# Patient Record
Sex: Male | Born: 1985 | Race: White | Hispanic: No | Smoking: Former smoker
Health system: Southern US, Community
[De-identification: ages and names within clinical notes are randomized; demographics above are authoritative.]

## PROBLEM LIST (undated history)

## (undated) DIAGNOSIS — J449 Chronic obstructive pulmonary disease, unspecified: Secondary | ICD-10-CM

## (undated) DIAGNOSIS — J45909 Unspecified asthma, uncomplicated: Secondary | ICD-10-CM

## (undated) DIAGNOSIS — F32A Depression, unspecified: Secondary | ICD-10-CM

## (undated) DIAGNOSIS — G4730 Sleep apnea, unspecified: Secondary | ICD-10-CM

## (undated) DIAGNOSIS — K219 Gastro-esophageal reflux disease without esophagitis: Secondary | ICD-10-CM

## (undated) DIAGNOSIS — F419 Anxiety disorder, unspecified: Secondary | ICD-10-CM

## (undated) HISTORY — DX: Chronic obstructive pulmonary disease, unspecified: J44.9

## (undated) HISTORY — DX: Unspecified asthma, uncomplicated: J45.909

## (undated) HISTORY — DX: Sleep apnea, unspecified: G47.30

---

## 2011-10-23 HISTORY — PX: KNEE ARTHROSCOPY: SUR90

## 2018-10-22 HISTORY — PX: NM RENAL LASIX (ARMC HX): HXRAD1213

## 2021-06-29 ENCOUNTER — Other Ambulatory Visit: Payer: Self-pay | Admitting: Surgery

## 2021-06-29 ENCOUNTER — Other Ambulatory Visit (HOSPITAL_COMMUNITY): Payer: Self-pay | Admitting: Surgery

## 2021-08-24 ENCOUNTER — Ambulatory Visit (INDEPENDENT_AMBULATORY_CARE_PROVIDER_SITE_OTHER): Payer: No Typology Code available for payment source | Admitting: Psychology

## 2021-08-24 DIAGNOSIS — F509 Eating disorder, unspecified: Secondary | ICD-10-CM

## 2021-08-29 ENCOUNTER — Other Ambulatory Visit: Payer: Self-pay

## 2021-08-29 ENCOUNTER — Encounter: Payer: Self-pay | Admitting: Skilled Nursing Facility1

## 2021-08-29 ENCOUNTER — Encounter: Payer: No Typology Code available for payment source | Attending: Surgery | Admitting: Skilled Nursing Facility1

## 2021-08-29 DIAGNOSIS — E669 Obesity, unspecified: Secondary | ICD-10-CM | POA: Diagnosis present

## 2021-08-29 NOTE — Progress Notes (Signed)
Nutrition Assessment for Bariatric Surgery Medical Nutrition Therapy Appt Start Time: 9:50    End Time: 11:30  Patient was seen on 08/29/2021 for Pre-Operative Nutrition Assessment. Letter of approval faxed to [facility] bariatric surgery program coordinator on 08/29/2021.   Referral stated Supervised Weight Loss (SWL) visits needed: 0  Pt completed visits.   Pt has cleared nutrition requirements.   Planned surgery: sleeve gastrectomy  Pt expectation of surgery: to reduce pain in his legs Pt expectation of dietitian: none identified     NUTRITION ASSESSMENT   Anthropometrics  Start weight at [facility]: 327.5 lbs (date: 08/29/2021)  Height: 75.5 in BMI: 40.39 kg/m2     Clinical  Medical hx: COPD, sleep apnea, GAD, Major depressive  Medications: see list; multivitamin   Labs: Vitamin D 20.8 Notable signs/symptoms: some knee joint pain Any previous deficiencies? Yes: vitamin D  Micronutrient Nutrition Focused Physical Exam: Hair: No issues observed Eyes: No issues observed Mouth: No issues observed Neck: No issues observed Nails: No issues observed Skin: No issues observed  Lifestyle & Dietary Hx  Pt states if he does experience pain with working out.  Pt states he will eat the rest of his daughters plate so they do not waste food but has been avoiding that behavior stating it has gone pretty easy to change that up. Pt states he has been working on portion control overall.   Pt states he has on and off sleep and is trying to working on better sleep hygeine.    24-Hr Dietary Recall First Meal: fruity pebbles cereal + almond milk Snack:  Second Meal 11-1: canned soup or ramen  Snack:  Third Meal 7:30: pork chops + white rice + broccoli + cheese Snack:  Beverages: water   Estimated Energy Needs Calories: 2000   NUTRITION DIAGNOSIS  Overweight/obesity ([place]-3.3) related to past poor dietary habits and physical inactivity as evidenced by patient w/ planned  sleeve gastrectomy surgery following dietary guidelines for continued weight loss.    NUTRITION INTERVENTION  Nutrition counseling (C-1) and education (E-2) to facilitate bariatric surgery goals.  Educated pt on micronutrient deficiencies post surgery and strategies to mitigate that risk   Pre-Op Goals Reviewed with the Patient Track food and beverage intake (pen and paper, MyFitness Pal, Baritastic app, etc.) Make healthy food choices while monitoring portion sizes Consume 3 meals per day or try to eat every 3-5 hours Avoid concentrated sugars and fried foods Keep sugar & fat in the single digits per serving on food labels Practice CHEWING your food (aim for applesauce consistency) Practice not drinking 15 minutes before, during, and 30 minutes after each meal and snack Avoid all carbonated beverages (ex: soda, sparkling beverages)  Limit caffeinated beverages (ex: coffee, tea, energy drinks) Avoid all sugar-sweetened beverages (ex: regular soda, sports drinks)  Avoid alcohol  Aim for 64-100 ounces of FLUID daily (with at least half of fluid intake being plain water)  Aim for at least 60-80 grams of PROTEIN daily Look for a liquid protein source that contains ?15 g protein and ?5 g carbohydrate (ex: shakes, drinks, shots) Make a list of non-food related activities Physical activity is an important part of a healthy lifestyle so keep it moving! The goal is to reach 150 minutes of exercise per week, including cardiovascular and weight baring activity.  *Goals that are bolded indicate the pt would like to start working towards these  Handouts Provided Include  Bariatric Surgery handouts (Nutrition Visits, Pre-Op Goals, Protein Shakes, Vitamins & Minerals)  Learning Style & Readiness for Change Teaching method utilized: Visual & Auditory  Demonstrated degree of understanding via: Teach Back  Readiness Level: Action Barriers to learning/adherence to lifestyle change: none identified     MONITORING & EVALUATION Dietary intake, weekly physical activity, body weight, and pre-op goals reached at next nutrition visit.    Next Steps  Patient is to follow up at [facility] for Pre-Op Class >2 weeks before surgery for further nutrition education.  Pt has completed visits. No further supervised visits required/recomended

## 2021-09-04 ENCOUNTER — Ambulatory Visit: Payer: No Typology Code available for payment source | Admitting: Psychology

## 2021-09-08 ENCOUNTER — Other Ambulatory Visit (HOSPITAL_COMMUNITY): Payer: Self-pay | Admitting: Surgery

## 2021-09-11 ENCOUNTER — Ambulatory Visit: Payer: Self-pay | Admitting: Surgery

## 2021-09-22 ENCOUNTER — Other Ambulatory Visit: Payer: Self-pay

## 2021-09-22 ENCOUNTER — Ambulatory Visit (HOSPITAL_BASED_OUTPATIENT_CLINIC_OR_DEPARTMENT_OTHER)
Admission: RE | Admit: 2021-09-22 | Discharge: 2021-09-22 | Disposition: A | Discharge status: Home / Self Care | Payer: No Typology Code available for payment source | Source: Ambulatory Visit | Attending: Surgery | Admitting: Surgery

## 2021-09-28 ENCOUNTER — Encounter (HOSPITAL_COMMUNITY)
Admission: RE | Admit: 2021-09-28 | Discharge: 2021-09-28 | Disposition: A | Discharge status: Home / Self Care | Payer: No Typology Code available for payment source | Source: Ambulatory Visit | Attending: Surgery | Admitting: Surgery

## 2021-09-28 DIAGNOSIS — Z01818 Encounter for other preprocedural examination: Secondary | ICD-10-CM | POA: Diagnosis not present

## 2021-11-03 ENCOUNTER — Encounter (HOSPITAL_COMMUNITY): Payer: Self-pay | Admitting: Surgery

## 2021-11-14 ENCOUNTER — Ambulatory Visit (HOSPITAL_COMMUNITY): Payer: No Typology Code available for payment source | Admitting: Certified Registered"

## 2021-11-14 ENCOUNTER — Encounter (HOSPITAL_COMMUNITY): Payer: Self-pay | Admitting: Surgery

## 2021-11-14 ENCOUNTER — Encounter (HOSPITAL_COMMUNITY)
Admission: RE | Disposition: A | Discharge status: Home / Self Care | Payer: Self-pay | Source: Ambulatory Visit | Attending: Surgery

## 2021-11-14 ENCOUNTER — Ambulatory Visit (HOSPITAL_COMMUNITY)
Admission: RE | Admit: 2021-11-14 | Discharge: 2021-11-14 | Disposition: A | Discharge status: Home / Self Care | Payer: No Typology Code available for payment source | Source: Ambulatory Visit | Attending: Surgery | Admitting: Surgery

## 2021-11-14 ENCOUNTER — Other Ambulatory Visit: Payer: Self-pay

## 2021-11-14 DIAGNOSIS — J449 Chronic obstructive pulmonary disease, unspecified: Secondary | ICD-10-CM | POA: Diagnosis not present

## 2021-11-14 DIAGNOSIS — Z6841 Body Mass Index (BMI) 40.0 and over, adult: Secondary | ICD-10-CM | POA: Insufficient documentation

## 2021-11-14 DIAGNOSIS — K219 Gastro-esophageal reflux disease without esophagitis: Secondary | ICD-10-CM | POA: Diagnosis present

## 2021-11-14 DIAGNOSIS — G4733 Obstructive sleep apnea (adult) (pediatric): Secondary | ICD-10-CM | POA: Diagnosis not present

## 2021-11-14 HISTORY — PX: ESOPHAGOGASTRODUODENOSCOPY: SHX5428

## 2021-11-14 HISTORY — PX: BIOPSY: SHX5522

## 2021-11-14 SURGERY — EGD (ESOPHAGOGASTRODUODENOSCOPY)
Anesthesia: Monitor Anesthesia Care

## 2021-11-14 MED ORDER — PROPOFOL 500 MG/50ML IV EMUL
INTRAVENOUS | Status: DC | PRN
  Administered 2021-11-14: 125 ug/kg/min via INTRAVENOUS

## 2021-11-14 MED ORDER — LACTATED RINGERS IV SOLN: INTRAVENOUS | Status: DC

## 2021-11-14 MED ORDER — PROPOFOL 10 MG/ML IV BOLUS
INTRAVENOUS | Status: DC | PRN
  Administered 2021-11-14 (×2): 30 mg via INTRAVENOUS
  Administered 2021-11-14: 20 mg via INTRAVENOUS

## 2021-11-14 MED ORDER — PROPOFOL 1000 MG/100ML IV EMUL
INTRAVENOUS | Status: AC
  Filled 2021-11-14: qty 100

## 2021-11-14 NOTE — H&P (Signed)
Admitting Physician: [place]  Service: Bariatric surgery  CC: GERD, Obesity  Subjective   HPI: Terry Pineda is an 36 y.o. male who is here for EGD prior to bariatric surgery.  Past Medical History:  Diagnosis Date   Asthma    COPD (chronic obstructive pulmonary disease) (HCC)    Sleep apnea     History reviewed. No pertinent surgical history.  History reviewed. No pertinent family history.  Social:  has no history on file for tobacco use, alcohol use, and drug use.  Allergies: No Known Allergies  Medications: Current Outpatient Medications  Medication Instructions   albuterol (VENTOLIN HFA) 108 (90 Base) MCG/ACT inhaler 1-2 puffs, Inhalation, Every 6 hours PRN   amitriptyline (ELAVIL) 50 mg, Oral, Daily at bedtime   FLUoxetine (PROZAC) 20 mg, Oral, 2 times daily   hydrOXYzine (ATARAX) 25 mg, Oral, 3 times daily    ROS - all of the below systems have been reviewed with the patient and positives are indicated with bold text General: chills, fever or night sweats Eyes: blurry vision or double vision ENT: epistaxis or sore throat Allergy/Immunology: itchy/watery eyes or nasal congestion Hematologic/Lymphatic: bleeding problems, blood clots or swollen lymph nodes Endocrine: temperature intolerance or unexpected weight changes Breast: new or changing breast lumps or nipple discharge Resp: cough, shortness of breath, or wheezing CV: chest pain or dyspnea on exertion GI: as per HPI GU: dysuria, trouble voiding, or hematuria MSK: joint pain or joint stiffness Neuro: TIA or stroke symptoms Derm: pruritus and skin lesion changes Psych: anxiety and depression  Objective   PE There were no vitals taken for this visit. Constitutional: NAD; conversant; no deformities Eyes: Moist conjunctiva; no lid lag; anicteric; PERRL Neck: Trachea midline; no thyromegaly Lungs: Normal respiratory effort; no tactile fremitus CV: RRR; no palpable thrills; no  pitting edema GI: Abd Soft, nontender; no palpable hepatosplenomegaly MSK: Normal range of motion of extremities; no clubbing/cyanosis Psychiatric: Appropriate affect; alert and oriented x3 Lymphatic: No palpable cervical or axillary lymphadenopathy  No results found for this or any previous visit (from the past 24 hour(s)).  Imaging Orders  No imaging studies ordered today     Assessment and Plan   Morbid (severe) obesity due to excess calories (CMS-HCC) Obstructive sleep apnea  Terry Pineda is a 36 y.o. male who is seen today as an office consultation at the request of Dr. Daneil Dolin for initial bariatric surgery consultation. The patient has morbid obesity with a BMI of Body mass index is 38 kg/m. and the following conditions related to obesity: Obstructive sleep apnea. Notably his blood pressure was high in the office, I will have him check his blood pressure at home and follow-up with his primary care doctor if it remains high on home checks..  Today we discussed the surgical options to treat obesity and its associated comorbidity. After discussing the available procedures in the region, we discussed in great detail the surgeries I offer: robotic sleeve gastrectomy and robotic roux-en-y gastric bypass. We discussed the procedures themselves as well as their risks, benefits and alternatives. I entered the patient's basic information into the [facility] Metabolic Surgery Risk/Benefit Calculator to facilitate this discussion.   After a full discussion and all questions answered, the patient is interested in pursuing a sleeve gastrectomy.  The bariatric surgery preoperative pathway to included the following: - Bloodwork - completed 06/30/21 - Dietician consult - completed with Sandie Ano, RD 08/29/21 - Chest x-ray - negative 09/22/21 - EKG - Normal sinus  rhythm 09/28/21 - Psychology evaluation - Notably his blood pressure was high in the office, I will have him check his blood  pressure at home and follow-up with his primary care doctor if it remains high on home checks.  Today he presents for upper endoscopy with biopsy. I explained my rational for performing upper endoscopy in my bariatric patients. During the procedure I will biopsy for H. Pylori, evaluate for hiatal hernia, evaluate for reflux esophagitis and look for any other abnormalities that may influence the procedure. We discussed the risks, benefits and alternative to this procedure and the patient granted consent to proceed.  After the above evaluation is complete, we will meet to continue our discussion and work towards scheduling surgery.     Felicie Morn, MD  [facility] Use [email or web address] to contact on call provider

## 2021-11-14 NOTE — Anesthesia Procedure Notes (Signed)
Procedure Name: MAC Date/Time: 11/14/2021 1:00 PM Performed by: Claudia Desanctis, CRNA Pre-anesthesia Checklist: Patient identified, Emergency Drugs available, Suction available and Patient being monitored Patient Re-evaluated:Patient Re-evaluated prior to induction Oxygen Delivery Method: Simple face mask

## 2021-11-14 NOTE — Anesthesia Preprocedure Evaluation (Addendum)
Anesthesia Evaluation  Patient identified by MRN, date of birth, ID band Patient awake    Reviewed: Allergy & Precautions, NPO status , Patient's Chart, lab work & pertinent test results  Airway Mallampati: II  TM Distance: >3 FB Neck ROM: Full    Dental no notable dental hx.    Pulmonary asthma , sleep apnea , COPD, former smoker,    Pulmonary exam normal breath sounds clear to auscultation       Cardiovascular negative cardio ROS Normal cardiovascular exam Rhythm:Regular Rate:Normal  ECG: NSR, rate 99   Neuro/Psych negative neurological ROS     GI/Hepatic Neg liver ROS, GERD  ,  Endo/Other  Morbid obesity  Renal/GU negative Renal ROS     Musculoskeletal negative musculoskeletal ROS (+)   Abdominal (+) + obese,   Peds  Hematology negative hematology ROS (+)   Anesthesia Other Findings gastroesophageal reflux disease, morbid obesity  Reproductive/Obstetrics                            Anesthesia Physical Anesthesia Plan  ASA: 3  Anesthesia Plan: MAC   Post-op Pain Management:    Induction: Intravenous  PONV Risk Score and Plan: 1 and Propofol infusion and Treatment may vary due to age or medical condition  Airway Management Planned: Nasal Cannula  Additional Equipment:   Intra-op Plan:   Post-operative Plan:   Informed Consent: I have reviewed the patients History and Physical, chart, labs and discussed the procedure including the risks, benefits and alternatives for the proposed anesthesia with the patient or authorized representative who has indicated his/her understanding and acceptance.     Dental advisory given  Plan Discussed with: CRNA  Anesthesia Plan Comments:         Anesthesia Quick Evaluation

## 2021-11-14 NOTE — Transfer of Care (Signed)
Immediate Anesthesia Transfer of Care Note  Patient: Terry Pineda  Procedure(s) Performed: ESOPHAGOGASTRODUODENOSCOPY (EGD) BIOPSY  Patient Location: PACU  Anesthesia Type:MAC  Level of Consciousness: awake, alert  and oriented  Airway & Oxygen Therapy: Patient Spontanous Breathing  Post-op Assessment: Report given to RN  Post vital signs: Reviewed and stable  Last Vitals:  Vitals Value Taken Time  BP    Temp    Pulse    Resp    SpO2      Last Pain:  Vitals:   11/14/21 1136  TempSrc: Oral  PainSc: 0-No pain         Complications: No notable events documented.

## 2021-11-14 NOTE — Op Note (Signed)
[facility] Patient Name: Terry Pineda Procedure Date: 11/14/2021 MRN: [id number] Attending MD: Felicie Morn ,  Date of Birth: 01/21/1986 CSN: [id number] Age: 36 Admit Type: Inpatient Procedure:                Upper GI endoscopy Indications:              Obesity due to excess calories, Helicobacter pylori                            status to be determined Providers:                Felicie Morn, Dulcy Fanny, Luan Moore, Technician, Dellie Catholic Referring MD:              Medicines:                Monitored Anesthesia Care Complications:            No immediate complications. Estimated Blood Loss:     Estimated blood loss: none. Estimated blood loss                            was minimal. Procedure:                Pre-Anesthesia Assessment:                           - Prior to the procedure, a History and Physical                            was performed, and patient medications and                            allergies were reviewed. The patient is competent.                            The risks and benefits of the procedure and the                            sedation options and risks were discussed with the                            patient. All questions were answered and informed                            consent was obtained. Patient identification and                            proposed procedure were verified by the physician                            in the pre-procedure area. Mental Status                            Examination:  alert and oriented. Airway                            Examination: normal oropharyngeal airway and neck                            mobility. Respiratory Examination: clear to                            auscultation. CV Examination: normal. ASA Grade                            Assessment: II - A patient with mild systemic                            disease. After reviewing the risks  and benefits,                            the patient was deemed in satisfactory condition to                            undergo the procedure. The anesthesia plan was to                            use monitored anesthesia care (MAC). Immediately                            prior to administration of medications, the patient                            was re-assessed for adequacy to receive sedatives.                            The heart rate, respiratory rate, oxygen                            saturations, blood pressure, adequacy of pulmonary                            ventilation, and response to care were monitored                            throughout the procedure. The physical status of                            the patient was re-assessed after the procedure.                           After obtaining informed consent, the endoscope was                            passed under direct vision. Throughout the  procedure, the patient's blood pressure, pulse, and                            oxygen saturations were monitored continuously. The                            [id number]-H190 ([id number]) Olympus endoscope was introduced                            through the mouth, and advanced to the second part                            of duodenum. The upper GI endoscopy was                            accomplished without difficulty. The patient                            tolerated the procedure well. Scope In: Scope Out: Findings:      The examined esophagus was normal.      The entire examined stomach was normal. Biopsies were taken with a cold       forceps for Helicobacter pylori testing. Verification of patient       identification for the specimen was done. Estimated blood loss was       minimal.      The in the duodenum was normal.      The gastroesophageal flap valve was visualized endoscopically and       classified as Hill Grade I (prominent fold, tight to  endoscope). Impression:               - Normal esophagus.                           - Normal stomach. Biopsied.                           - Normal.                           - Gastroesophageal flap valve classified as Hill                            Grade I (prominent fold, tight to endoscope). Moderate Sedation:      Moderate (conscious) sedation was personally administered by an       anesthesia professional. The following parameters were monitored: oxygen       saturation, heart rate, blood pressure, and response to care. Total       physician intraservice time was 15 minutes. Recommendation:            Procedure Code(s):        --- Professional ---                           [phone], Esophagogastroduodenoscopy, flexible,                            transoral; with biopsy, single  or multiple Diagnosis Code(s):        --- Professional ---                           E66.09, Other obesity due to excess calories CPT copyright 2019 American Medical Association. All rights reserved. The codes documented in this report are preliminary and upon coder review may  be revised to meet current compliance requirements. [place],  11/14/2021 1:20:24 PM Number of Addenda: 0

## 2021-11-14 NOTE — Anesthesia Postprocedure Evaluation (Signed)
Anesthesia Post Note  Patient: Terry Pineda  Procedure(s) Performed: ESOPHAGOGASTRODUODENOSCOPY (EGD) BIOPSY     Patient location during evaluation: Endoscopy Anesthesia Type: MAC Level of consciousness: awake Pain management: pain level controlled Vital Signs Assessment: post-procedure vital signs reviewed and stable Respiratory status: spontaneous breathing, nonlabored ventilation, respiratory function stable and patient connected to nasal cannula oxygen Cardiovascular status: stable and blood pressure returned to baseline Postop Assessment: no apparent nausea or vomiting Anesthetic complications: no   No notable events documented.  Last Vitals:  Vitals:   11/14/21 1330 11/14/21 1340  BP: 129/79 135/74  Pulse: 70 65  Resp:  13  Temp:    SpO2: 98% 98%    Last Pain:  Vitals:   11/14/21 1340  TempSrc:   PainSc: 0-No pain                 Royce Stegman P Wesly Whisenant

## 2021-11-14 NOTE — Discharge Instructions (Signed)
YOU HAD AN ENDOSCOPIC PROCEDURE TODAY: Refer to the procedure report and other information in the discharge instructions given to you for any specific questions about what was found during the examination. If this information does not answer your questions, please call [facility] office at [phone] to clarify.   YOU SHOULD EXPECT: Some feelings of bloating in the abdomen. Passage of more gas than usual. Walking can help get rid of the air that was put into your GI tract during the procedure and reduce the bloating.  DIET: Your first meal following the procedure should be a light meal and then it is ok to progress to your normal diet. A half-sandwich or bowl of soup is an example of a good first meal. Heavy or fried foods are harder to digest and may make you feel nauseous or bloated. Drink plenty of fluids but you should avoid alcoholic beverages for 24 hours.  ACTIVITY: Your care partner should take you home directly after the procedure. You should plan to take it easy, moving slowly for the rest of the day. You can resume normal activity the day after the procedure however YOU SHOULD NOT DRIVE, use power tools, machinery or perform tasks that involve climbing or major physical exertion for 24 hours (because of the sedation medicines used during the test).   SYMPTOMS TO REPORT IMMEDIATELY: A general surgeon can be reached at any hour. Please call [phone]  for any of the following symptoms:   Following upper endoscopy (EGD, EUS, ERCP, esophageal dilation) Vomiting of blood or coffee ground material  New, significant abdominal pain  New, significant chest pain or pain under the shoulder blades  Painful or persistently difficult swallowing  New shortness of breath  Black, tarry-looking or red, bloody stools  FOLLOW UP:  If any biopsies were taken you will be contacted by phone or by letter within the next 1-3 weeks. Call [phone]  if you have not heard about the biopsies  in 3 weeks.  Please also call with any specific questions about appointments or follow up tests.  

## 2021-11-15 ENCOUNTER — Encounter (HOSPITAL_COMMUNITY): Payer: Self-pay | Admitting: Surgery

## 2021-11-15 LAB — SURGICAL PATHOLOGY

## 2022-01-31 ENCOUNTER — Ambulatory Visit (INDEPENDENT_AMBULATORY_CARE_PROVIDER_SITE_OTHER): Payer: No Typology Code available for payment source | Admitting: Psychology

## 2022-01-31 DIAGNOSIS — F509 Eating disorder, unspecified: Secondary | ICD-10-CM

## 2022-01-31 NOTE — Progress Notes (Signed)
. ? ? Bariatric Evaluation  ?  ?CONFIDENTIAL  ?Client Name: Terry Pineda                                                                                  MRN:  [id number] ?Date of Birth: 05-11-1986                                                                  Date of Evaluation: 08/24/21 ?Total Assessment Time: 108 Minutes                                                                     Date of Report: 08/24/21 ?Evaluator: Buena Irish, MSW, LCSW                                                               Date of Follow-up: 01/31/2022 ?Referring Physician:   Dr. Delford Field,  ?[facility] ?  ? ?Sources of Information ?Background Interview ?Bariatric Questionnaire ?[place] Interview for Gastric Bypass ?Beck Anxiety Inventory (BAI) ?Beck Depression Inventory, 2nd Edition (BDI-II) ?Eating Disorder Diagnostic Scale (EDDS) ?Mental Status Examination ?Mood Disorder Questionnaire (MDQ) ?Review of Medical Record (provided by [facility]) ?Letter from Psychiatrist ?Letter from Therapist ? ?Patient Identification and Chief Complaint: ?Terry Pineda lives in [place], [place]. Terry Pineda lives with his teenage daughter. Terry Pineda's father is aware and supportive of his pursuit of bariatric surgery. Terry Pineda's father will be traveling to [place]'s home to help with aftercare post-operatively.  ? ?Terry Pineda is not currently employed and is currently on [employer] disability due to mental and physical health concerns. Terry Pineda was previously in [employer] but was discharged ~11 years ago.   ? ?Terry Pineda has history of sleep apnea, major depression, and anxiety. Additionally, shortness of breath, arthritis (neck, knees, and ankles), and reflux.  Terry Pineda is receiving psychiatric services (Dr. Burr Medico) via the [facility] and is prescribed Hydroxyzine 65m qd  ?and Prozac 246mqd. Terry Pineda a history of counseling and found some benefit, at times, to treatment. Terry Pineda denied caffeine use, alcohol, or tobacco use. Terry Pineda denied any substance use.  ? ? ?Patient's  Understanding of the Procedure: ?Terry Pineda noted his interest in the gastric sleeve. Terry Pineda provided a description of the surgical process including the use of laparoscopy, removal of a large portion of the stomach, and the creation of a pouch. Terry Pineda was able to review many of the risks associated with the surgery and anesthesia. Terry Pineda would benefit from an in-depth of the risks associated post-surgery and the  less common side-effects of surgery. Additionally, Terry Pineda would benefit from in-depth of the dietary regimen. Terry Pineda noted his motivation for surgery which includes improving his health and fitness.  ? ? ?History of Weight Gains and Losses: ?Current weight, height, BMI, desired weight: 325lbs, 6'5?, ~200.  ?Rate current activity level from 1 (sedentary) to 10 (very active): 2 ?Methods of exercise: Walking dog (3x 5-10 minutes). Limited by physical pain.  ?Eating Disorder History (binging, purging, laxatives): Terry Pineda denied any history of diagnosed disordered eating.  ?History of dieting and results: ? ?~2011: Terry Pineda, lost 7-10lbs, maintained 60 days.  ?~2014: Atkins, Lost 7-10lbs, maintained for 30 days.  ?~2017: Terry Pineda fast, no weight-loss due to gastrointestinal issues.  ?Current: Counting calories (reduction of soda consumption and excess calories), weight-loss totals fluctuate.  ?~2019: [employer] Diet, lost 20lbs, maintained for 30 days.  ? ?Diet and Plans for the Future: ? ?Breakfast Cereal or eggs and bacon  ?Lunch Usually left overs  ?Dinner [employer] or ground beef mixed veg Red skin potatoes white rice  ?Snacks Denied   ?  ?Plans for exercise post-surgery: Lifting weights. ?How pt copes with stress: Sleep, therapy.   ?Other planned changes for the future: Improve health and fitness.  ? ?Mental Status Examination: ?Terry Pineda was early to the session which was completed via video-conferencing interview. Terry Pineda was dressed casually and his hygiene was observed to be good. Terry Pineda was oriented to person, place, and time. His  attitude was cooperative and cheerful. There were no unusual psychomotor movements or changes. Speech patterns were normal in rate, tone, volume, and without pressure. Affect was flat. Thought processes were goal-directed and logical. Memory and concentration for both long and short-term were intact. Insight and judgement were good overall. ? ?Intellectual and Psychological Functioning ?Terry Pineda was very warm and friendly. Terry Pineda presented with no significant formal thought disorder. Terry Pineda appears to be primarily facing health stressors, which aligns with his interest in the bariatric process. Terry Pineda completed the Mood disorder Questionnaire -and did not meet criteria for Bipolar Disorder. Additionally, Terry Pineda completed the following measures, Beck's Depression Index netted a score of      31/63, which is indicative of SEVERE depression symptomatology Beck Anxiety Inventory netted a score   17/63 which is indicative of moderate anxiety symptomatology, and Eating Disorder Diagnostic Scale - in which Terry Pineda did not meet criteria for an eating disorder. However, it should be noted that Terry Pineda has a history of binging behavior around ~1.5 years ago without loss of control. Terry Pineda noted often eating 3 servings of a meal while his daughter ate one serving. [name] denied any binging since ~1.5 years ago.  ? ?Terry Pineda has a history of depression and received psychiatric treatment with the [place]. Terry Pineda presented with a flat affect, presented as depressed, and endorsed depressive symptoms via self-report using Beck's Depression Index which netted a score of 31/63. This is indicative of severe depression. Additionally, Terry Pineda endorsed a fluctuating sleep scheduled and hypersomnia, often sleeping 12-14 hours per day in total. Terry Pineda also endorsed loss of interest. Terry Pineda will be required to address his mood via a psychiatric consult and engagement in therapy. Once Terry Pineda has had a follow-up with his prescriber and engaged in therapy, Terry Pineda will need to  provide evaluator with letter from both providers discussed treatment, progress, and prognosis. Rehaan received a template regarding the requested information. Once Terry Pineda has made the necessary progress, which would reflect in his overall mood, Macky will be scheduled for the follow-up for this evaluation  to complete the evaluation process. Additionally, Ahad has not yet met with the registered dietician, which is often a required step prior to consideration with surgery.  ? ?01/31/22 ? ?Since the initial evaluation, Camryn has been receiving psychological treatment from Abelardo Diesel, [facility], [place] with [facility] in [place], [place]. Ms. Gilford Rile has provided a letter, which will be included with the evaluation, reviewing his treatment. Ms. Gilford Rile stated that Wilian has been meeting with her for sessions every two weeks. She noted that she intends to continue treatment with Nicki Reaper, which was confirmed verbally by Nicki Reaper during our follow-up today.  ? ?Additionally, Filipe provided a letter from his psychiatric provider, Dr. Burr Medico with the [facility] of [facility]. This letter will also be included with this evaluation for review.  ? ?Conclusions & Recommendations ?Mr. Adrin Julian is a 36 year-old Caucasian male who was referred to the [place] division by Dr. Delford Field at [facility] for a psychological evaluation to determine his suitability for bariatric surgery.  ? ?With regard to bariatric surgery, Mr. Latron Ribas appears to be motivated for surgery but would benefit from an in-depth of the risks associated post-surgery and the less common side-effects of surgery. Additionally, Terry Pineda would benefit from in-depth of the dietary regimen. Results of this evaluation yielded a past history of depression and anxiety. At this time, Mr. Jeremyah Jelley appears to be able to make an informed decision about the surgery Terry Pineda is contemplating. Terry Pineda expressed understanding of the  post-surgical requirements and appears to me motivated to complete said requirements. If his surgery is scheduled more than 3 months from today's date (01/31/2022) Terry Pineda is required to schedule a follow-up visit in order for th

## 2022-01-31 NOTE — Progress Notes (Signed)
Time Spent: 8:05  am - 8:38 am : 33 Minutes ? ?Hamish Banks Fritzler participated in the session via  video from car and consented to treatment. We later switched to audio due to technical difficulties. We met online due to COVID-19 pandemic. Identifty was confirmed via three factors. Therapist participated from home office. We completed the interactive feedback session which includes reviewing the following measures: Beck Anxiety Inventory (BAI), Beck's Depression Index (BDI), Eating Disorder Diagnostic Scale (EDDS), Mental Status Examination (MSE), & Mood Disorder Questionnaire (MDQ). During this interactive feedback session we discussed the results of the aforementioned measures, treatment recommendations, and the final determination regarding the psychological approval for bariatric surgery. Terry Pineda is approved for surgery.  ? ?Code 96130 was billed to account for written report which includes integration of patient data, interpretation of standardized test results, interpretation of clinical data, & clinical decision making (60 minutes in total) completed on 08/24/2021. ? ?The interactive feedback session was completed today and a total of 33 minutes was spent on feedback alone. Code [phone] was billed for feedback session.  ? ?Diagnosis code: e66.01 ? ?Buena Irish, LCSW ?

## 2022-03-02 ENCOUNTER — Ambulatory Visit: Payer: Self-pay | Admitting: Surgery

## 2022-03-02 DIAGNOSIS — Z01818 Encounter for other preprocedural examination: Secondary | ICD-10-CM

## 2022-03-09 NOTE — Patient Instructions (Signed)
DUE TO COVID-19 ONLY TWO VISITORS  (aged 34 and older)  ARE ALLOWED TO COME WITH YOU AND STAY IN THE WAITING ROOM ONLY DURING PRE OP AND PROCEDURE.   **NO VISITORS ARE ALLOWED IN THE SHORT STAY AREA OR RECOVERY ROOM!!**  IF YOU WILL BE ADMITTED INTO THE HOSPITAL YOU ARE ALLOWED ONLY FOUR SUPPORT PEOPLE DURING VISITATION HOURS ONLY (7 AM -8PM)   The support person(s) must pass our screening, gel in and out, and wear a mask at all times, including in the patient's room. Patients must also wear a mask when staff or their support person are in the room. Visitors GUEST BADGE MUST BE WORN VISIBLY  One adult visitor may remain with you overnight and MUST be in the room by 8 P.M.     Your procedure is scheduled on: 03/26/22   Report to [facility] Main Entrance    Report to short stay at 5:15  AM   Call this number if you have problems the morning of surgery [phone]   Do not eat food :After 6:00 PM   After you may have the following liquids until _4:30_ AM/  DAY OF SURGERY  Water Black Coffee (sugar ok, NO MILK/CREAM OR CREAMERS)  Tea (sugar ok, NO MILK/CREAM OR CREAMERS) regular and decaf                             Plain Jell-O (NO RED)                                           Fruit ices (not with fruit pulp, NO RED)                                     Popsicles (NO RED)                                                                  Juice: apple, WHITE grape, WHITE cranberry Sports drinks like Gatorade (NO RED) Clear broth(vegetable,chicken,beef)                     The day of surgery:   Nothing else to drink after completing the   G2. At 4:30 AM    THE G2 DRINK YOU DRINK BEFORE YOU LEAVE HOME WILL BE THE LAST FLUIDS YOU DRINK BEFORE SURGERY THEN NOTHING MORE BY MOUTH.    PAIN IS EXPECTED AFTER SURGERY AND WILL NOT BE COMPLETELY ELIMINATED.   AMBULATION AND TYLENOL WILL HELP REDUCE INCISIONAL AND GAS PAIN. MOVEMENT IS KEY!  YOU ARE EXPECTED TO BE OUT OF BED  WITHIN 4 HOURS OF ADMISSION TO YOUR PATIENT ROOM.  SITTING IN THE RECLINER THROUGHOUT THE DAY IS IMPORTANT FOR DRINKING FLUIDS AND MOVING GAS THROUGHOUT THE GI TRACT.  COMPRESSION STOCKINGS SHOULD BE WORN [facility] STAY UNLESS YOU ARE WALKING.   INCENTIVE SPIROMETER SHOULD BE USED EVERY HOUR WHILE AWAKE TO DECREASE POST-OPERATIVE COMPLICATIONS SUCH AS PNEUMONIA.  WHEN DISCHARGED HOME, IT IS IMPORTANT TO CONTINUE TO WALK EVERY  HOUR AND USE THE INCENTIVE SPIROMETER EVERY HOUR.                 Oral Hygiene is also important to reduce your risk of infection.                                    Remember - BRUSH YOUR TEETH THE MORNING OF SURGERY WITH YOUR REGULAR TOOTHPASTE   Do NOT smoke after Midnight   Take these medicines the morning of surgery with A SIP OF WATER: Prozac, Hydroxyzine if needed Use your inhaler and bring it with you   Bring CPAP mask and tubing day of surgery.                              You may not have any metal on your body including jewelry, and body piercing             Do not wear lotions, powders, perfumes/cologne, or deodorant                Men may shave face and neck.   Do not bring valuables to the hospital. [facility] IS NOT             RESPONSIBLE   FOR VALUABLES.   Contacts, dentures or bridgework may not be worn into surgery.   Bring small overnight bag day of surgery.     Special Instructions: Bring a copy of your healthcare power of attorney and living will documents the day of surgery if you haven't scanned them before.              Please read over the following fact sheets you were given: IF YOU HAVE QUESTIONS ABOUT YOUR PRE-OP INSTRUCTIONS PLEASE CALL [phone]     [facility] - Preparing for Surgery Before surgery, you can play an important role.  Because skin is not sterile, your skin needs to be as free of germs as possible.  You can reduce the number of germs on your skin by washing with CHG (chlorahexidine  gluconate) soap before surgery.  CHG is an antiseptic cleaner which kills germs and bonds with the skin to continue killing germs even after washing. Please DO NOT use if you have an allergy to CHG or antibacterial soaps.  If your skin becomes reddened/irritated stop using the CHG and inform your nurse when you arrive at Short Stay.  You may shave your face/neck. Please follow these instructions carefully:  1.  Shower with CHG Soap the night before surgery and the  morning of Surgery.  2.  If you choose to wash your hair, wash your hair first as usual with your  normal  shampoo.  3.  After you shampoo, rinse your hair and body thoroughly to remove the  shampoo.                            4.  Use CHG as you would any other liquid soap.  You can apply chg directly  to the skin and wash                       Gently with a scrungie or clean washcloth.  5.  Apply the CHG Soap to your body ONLY FROM THE NECK DOWN.   Do not  use on face/ open                           Wound or open sores. Avoid contact with eyes, ears mouth and genitals (private parts).                       Wash face,  Genitals (private parts) with your normal soap.             6.  Wash thoroughly, paying special attention to the area where your surgery  will be performed.  7.  Thoroughly rinse your body with warm water from the neck down.  8.  DO NOT shower/wash with your normal soap after using and rinsing off  the CHG Soap.                9.  Pat yourself dry with a clean towel.            10.  Wear clean pajamas.            11.  Place clean sheets on your bed the night of your first shower and do not  sleep with pets. Day of Surgery : Do not apply any lotions/deodorants the morning of surgery.  Please wear clean clothes to the hospital/surgery center.  FAILURE TO FOLLOW THESE INSTRUCTIONS MAY RESULT IN THE CANCELLATION OF YOUR SURGERY PATIENT SIGNATURE_________________________________  NURSE  SIGNATURE__________________________________  ________________________________________________________________________   Rogelia Mire  An incentive spirometer is a tool that can help keep your lungs clear and active. This tool measures how well you are filling your lungs with each breath. Taking long deep breaths may help reverse or decrease the chance of developing breathing (pulmonary) problems (especially infection) following: A long period of time when you are unable to move or be active. BEFORE THE PROCEDURE  If the spirometer includes an indicator to show your best effort, your nurse or respiratory therapist will set it to a desired goal. If possible, sit up straight or lean slightly forward. Try not to slouch. Hold the incentive spirometer in an upright position. INSTRUCTIONS FOR USE  Sit on the edge of your bed if possible, or sit up as far as you can in bed or on a chair. Hold the incentive spirometer in an upright position. Breathe out normally. Place the mouthpiece in your mouth and seal your lips tightly around it. Breathe in slowly and as deeply as possible, raising the piston or the ball toward the top of the column. Hold your breath for 3-5 seconds or for as long as possible. Allow the piston or ball to fall to the bottom of the column. Remove the mouthpiece from your mouth and breathe out normally. Rest for a few seconds and repeat Steps 1 through 7 at least 10 times every 1-2 hours when you are awake. Take your time and take a few normal breaths between deep breaths. The spirometer may include an indicator to show your best effort. Use the indicator as a goal to work toward during each repetition. After each set of 10 deep breaths, practice coughing to be sure your lungs are clear. If you have an incision (the cut made at the time of surgery), support your incision when coughing by placing a pillow or rolled up towels firmly against it. Once you are able to get out of  bed, walk around indoors and cough well. You may stop using the incentive spirometer when  instructed by your caregiver.  RISKS AND COMPLICATIONS Take your time so you do not get dizzy or light-headed. If you are in pain, you may need to take or ask for pain medication before doing incentive spirometry. It is harder to take a deep breath if you are having pain. AFTER USE Rest and breathe slowly and easily. It can be helpful to keep track of a log of your progress. Your caregiver can provide you with a simple table to help with this. If you are using the spirometer at home, follow these instructions: SEEK MEDICAL CARE IF:  You are having difficultly using the spirometer. You have trouble using the spirometer as often as instructed. Your pain medication is not giving enough relief while using the spirometer. You develop fever of 100.5 F (38.1 C) or higher. SEEK IMMEDIATE MEDICAL CARE IF:  You cough up bloody sputum that had not been present before. You develop fever of 102 F (38.9 C) or greater. You develop worsening pain at or near the incision site. MAKE SURE YOU:  Understand these instructions. Will watch your condition. Will get help right away if you are not doing well or get worse. Document Released: 02/18/2007 Document Revised: 12/31/2011 Document Reviewed: 04/21/2007 [facility] Patient Information 2014 [employer], [place].   ________________________________________________________________________

## 2022-03-12 ENCOUNTER — Encounter (HOSPITAL_COMMUNITY)
Admission: RE | Admit: 2022-03-12 | Discharge: 2022-03-12 | Disposition: A | Discharge status: Home / Self Care | Payer: No Typology Code available for payment source | Source: Ambulatory Visit | Attending: Surgery | Admitting: Surgery

## 2022-03-12 ENCOUNTER — Encounter: Payer: No Typology Code available for payment source | Attending: Surgery | Admitting: Skilled Nursing Facility1

## 2022-03-12 ENCOUNTER — Other Ambulatory Visit: Payer: Self-pay

## 2022-03-12 ENCOUNTER — Encounter (HOSPITAL_COMMUNITY): Payer: Self-pay

## 2022-03-12 DIAGNOSIS — Z01812 Encounter for preprocedural laboratory examination: Secondary | ICD-10-CM | POA: Insufficient documentation

## 2022-03-12 DIAGNOSIS — E669 Obesity, unspecified: Secondary | ICD-10-CM | POA: Diagnosis not present

## 2022-03-12 DIAGNOSIS — Z01818 Encounter for other preprocedural examination: Secondary | ICD-10-CM

## 2022-03-12 HISTORY — DX: Gastro-esophageal reflux disease without esophagitis: K21.9

## 2022-03-12 HISTORY — DX: Anxiety disorder, unspecified: F41.9

## 2022-03-12 HISTORY — DX: Depression, unspecified: F32.A

## 2022-03-12 LAB — CBC WITH DIFFERENTIAL/PLATELET
Abs Immature Granulocytes: 0.02 10*3/uL (ref 0.00–0.07)
Basophils Absolute: 0 10*3/uL (ref 0.0–0.1)
Basophils Relative: 0 %
Eosinophils Absolute: 0.3 10*3/uL (ref 0.0–0.5)
Eosinophils Relative: 3 %
HCT: 47.9 % (ref 39.0–52.0)
Hemoglobin: 15.2 g/dL (ref 13.0–17.0)
Immature Granulocytes: 0 %
Lymphocytes Relative: 34 %
Lymphs Abs: 3.2 10*3/uL (ref 0.7–4.0)
MCH: 26.1 pg (ref 26.0–34.0)
MCHC: 31.7 g/dL (ref 30.0–36.0)
MCV: 82.3 fL (ref 80.0–100.0)
Monocytes Absolute: 0.5 10*3/uL (ref 0.1–1.0)
Monocytes Relative: 6 %
Neutro Abs: 5.4 10*3/uL (ref 1.7–7.7)
Neutrophils Relative %: 57 %
Platelets: 281 10*3/uL (ref 150–400)
RBC: 5.82 MIL/uL — ABNORMAL HIGH (ref 4.22–5.81)
RDW: 13.4 % (ref 11.5–15.5)
WBC: 9.5 10*3/uL (ref 4.0–10.5)
nRBC: 0 % (ref 0.0–0.2)

## 2022-03-12 LAB — COMPREHENSIVE METABOLIC PANEL
ALT: 32 U/L (ref 0–44)
AST: 22 U/L (ref 15–41)
Albumin: 4.3 g/dL (ref 3.5–5.0)
Alkaline Phosphatase: 78 U/L (ref 38–126)
Anion gap: 7 (ref 5–15)
BUN: 20 mg/dL (ref 6–20)
CO2: 28 mmol/L (ref 22–32)
Calcium: 9.2 mg/dL (ref 8.9–10.3)
Chloride: 105 mmol/L (ref 98–111)
Creatinine, Ser: 0.97 mg/dL (ref 0.61–1.24)
GFR, Estimated: >60 mL/min (ref >60)
Glucose, Bld: 117 mg/dL — ABNORMAL HIGH (ref 70–99)
Potassium: 3.5 mmol/L (ref 3.5–5.1)
Sodium: 140 mmol/L (ref 135–145)
Total Bilirubin: 0.7 mg/dL (ref 0.3–1.2)
Total Protein: 8 g/dL (ref 6.5–8.1)

## 2022-03-12 NOTE — Progress Notes (Signed)
Anesthesia note:  Bowel prep reminder:NA  PCP - Dr. Deborha Payment from [facility]. Cardiologist -none Other-   Chest x-ray - 09/22/21-epic EKG - 09/28/21-epic Stress Test - no ECHO - no Cardiac Cath - no  Pacemaker/ICD device last checked:NA  Sleep Study - yes CPAP - no . Can't tolerate the mask  Pt is pre diabetic-NA Fasting Blood Sugar -  Checks Blood Sugar _____  Blood Thinner:NA Blood Thinner Instructions: Aspirin Instructions: Last Dose:  Anesthesia review: no   Patient denies shortness of breath, fever, cough and chest pain at PAT appointment Pt gets winded after 1 flight of stairs but not doing housework or ADls his asthma is stable and rarely uses his inhaler. He quit smoking and it has been better.   Patient verbalized understanding of instructions that were given to them at the PAT appointment. Patient was also instructed that they will need to review over the PAT instructions again at home before surgery. yes

## 2022-03-13 ENCOUNTER — Encounter: Payer: Self-pay | Admitting: Skilled Nursing Facility1

## 2022-03-13 NOTE — Progress Notes (Signed)
Pre-Operative Nutrition Class:    Patient was seen on 03/12/2022 for Pre-Operative Bariatric Surgery Education at the [facility].    Surgery date: 03/26/2022 Surgery type: Sleeve Gastrectomy   Anthropometrics  Start weight at [facility]: 327.5 lbs (date: 08/29/2021)  Height: 75.5 in BMI: 40.39 kg/m2     Clinical  Medical hx: COPD, sleep apnea, GAD, Major depressive  Medications: see list; multivitamin   Labs: Vitamin D 20.8 Notable signs/symptoms: some knee joint pain Any previous deficiencies? Yes: vitamin D  Samples given per MNT protocol. Patient educated on appropriate usage: Bariatric Advantage Multivitamin Lot # [id number] Exp:08/24   Bariatric Advantage Calcium  Lot #[id number] Exp: 08/27/2022   Protein Shake Lot # 01/24 Exp: [id number]   The following the learning objectives were met by the patient during this course: Identify Pre-Op Dietary Goals and will begin 2 weeks pre-operatively Identify appropriate sources of fluids and proteins  State protein recommendations and appropriate sources pre and post-operatively Identify Post-Operative Dietary Goals and will follow for 2 weeks post-operatively Identify appropriate multivitamin and calcium sources Describe the need for physical activity post-operatively and will follow MD recommendations State when to call healthcare provider regarding medication questions or post-operative complications When having a diagnosis of diabetes understanding hypoglycemia symptoms and the inclusion of 1 complex carbohydrate per meal  Handouts given during class include: Pre-Op Bariatric Surgery Diet Handout Protein Shake Handout Post-Op Bariatric Surgery Nutrition Handout BELT Program Information Flyer Support Group Information Flyer [facility] Bariatric Supplements Price List  Follow-Up Plan: Patient will follow-up at [facility] 2 weeks post operatively for diet advancement per MD.

## 2022-03-14 ENCOUNTER — Encounter: Payer: Self-pay | Admitting: Skilled Nursing Facility1

## 2022-03-15 ENCOUNTER — Other Ambulatory Visit (HOSPITAL_COMMUNITY): Payer: Self-pay

## 2022-03-25 NOTE — Anesthesia Preprocedure Evaluation (Signed)
Anesthesia Evaluation  Patient identified by MRN, date of birth, ID band Patient awake    Reviewed: Allergy & Precautions, NPO status , Patient's Chart, lab work & pertinent test results  Airway Mallampati: I  TM Distance: >3 FB Neck ROM: Full   Comment: Large beard Dental no notable dental hx.    Pulmonary asthma , sleep apnea , COPD, former smoker,    Pulmonary exam normal breath sounds clear to auscultation       Cardiovascular Exercise Tolerance: Good negative cardio ROS Normal cardiovascular exam Rhythm:Regular Rate:Normal     Neuro/Psych PSYCHIATRIC DISORDERS Anxiety Depression negative neurological ROS     GI/Hepatic Neg liver ROS, GERD  ,  Endo/Other  Morbid obesity  Renal/GU negative Renal ROS  negative genitourinary   Musculoskeletal negative musculoskeletal ROS (+)   Abdominal (+) + obese,   Peds negative pediatric ROS (+)  Hematology negative hematology ROS (+)   Anesthesia Other Findings   Reproductive/Obstetrics negative OB ROS                            Anesthesia Physical Anesthesia Plan  ASA: 3  Anesthesia Plan: General   Post-op Pain Management: Tylenol PO (pre-op)* and Gabapentin PO (pre-op)*   Induction: Intravenous  PONV Risk Score and Plan: 2 and Treatment may vary due to age or medical condition, Midazolam, Dexamethasone and Ondansetron  Airway Management Planned: Oral ETT  Additional Equipment: None  Intra-op Plan:   Post-operative Plan: Extubation in OR  Informed Consent: I have reviewed the patients History and Physical, chart, labs and discussed the procedure including the risks, benefits and alternatives for the proposed anesthesia with the patient or authorized representative who has indicated his/her understanding and acceptance.     Dental advisory given  Plan Discussed with: Anesthesiologist, CRNA and Surgeon  Anesthesia Plan Comments:         Anesthesia Quick Evaluation

## 2022-03-26 ENCOUNTER — Encounter (HOSPITAL_COMMUNITY)
Admission: RE | Disposition: A | Discharge status: Home / Self Care | Payer: Self-pay | Source: Ambulatory Visit | Attending: Surgery

## 2022-03-26 ENCOUNTER — Inpatient Hospital Stay (HOSPITAL_COMMUNITY)
Admission: RE | Admit: 2022-03-26 | Discharge: 2022-03-27 | DRG: 621 | Disposition: A | Discharge status: Home / Self Care | Payer: No Typology Code available for payment source | Source: Ambulatory Visit | Attending: Surgery | Admitting: Surgery

## 2022-03-26 ENCOUNTER — Other Ambulatory Visit: Payer: Self-pay

## 2022-03-26 ENCOUNTER — Encounter (HOSPITAL_COMMUNITY): Payer: Self-pay | Admitting: Surgery

## 2022-03-26 ENCOUNTER — Inpatient Hospital Stay (HOSPITAL_COMMUNITY): Payer: No Typology Code available for payment source | Admitting: Anesthesiology

## 2022-03-26 ENCOUNTER — Other Ambulatory Visit (HOSPITAL_COMMUNITY): Payer: Self-pay

## 2022-03-26 DIAGNOSIS — J449 Chronic obstructive pulmonary disease, unspecified: Secondary | ICD-10-CM

## 2022-03-26 DIAGNOSIS — F32A Depression, unspecified: Secondary | ICD-10-CM | POA: Diagnosis present

## 2022-03-26 DIAGNOSIS — Z6841 Body Mass Index (BMI) 40.0 and over, adult: Secondary | ICD-10-CM

## 2022-03-26 DIAGNOSIS — Z79899 Other long term (current) drug therapy: Secondary | ICD-10-CM

## 2022-03-26 DIAGNOSIS — G4733 Obstructive sleep apnea (adult) (pediatric): Secondary | ICD-10-CM | POA: Diagnosis present

## 2022-03-26 DIAGNOSIS — G4730 Sleep apnea, unspecified: Secondary | ICD-10-CM | POA: Diagnosis not present

## 2022-03-26 DIAGNOSIS — Z01818 Encounter for other preprocedural examination: Secondary | ICD-10-CM

## 2022-03-26 DIAGNOSIS — Z87891 Personal history of nicotine dependence: Secondary | ICD-10-CM | POA: Diagnosis not present

## 2022-03-26 DIAGNOSIS — K219 Gastro-esophageal reflux disease without esophagitis: Secondary | ICD-10-CM | POA: Diagnosis present

## 2022-03-26 HISTORY — PX: UPPER GI ENDOSCOPY: SHX6162

## 2022-03-26 LAB — CREATININE, SERUM
Creatinine, Ser: 0.87 mg/dL (ref 0.61–1.24)
GFR, Estimated: >60 mL/min (ref >60)

## 2022-03-26 LAB — TYPE AND SCREEN
ABO/RH(D): O NEG
Antibody Screen: NEGATIVE

## 2022-03-26 LAB — HEMOGLOBIN AND HEMATOCRIT, BLOOD
HCT: 42 % (ref 39.0–52.0)
Hemoglobin: 13.7 g/dL (ref 13.0–17.0)

## 2022-03-26 LAB — ABO/RH: ABO/RH(D): O NEG

## 2022-03-26 LAB — CBC
HCT: 44.1 % (ref 39.0–52.0)
Hemoglobin: 14.3 g/dL (ref 13.0–17.0)
MCH: 26.3 pg (ref 26.0–34.0)
MCHC: 32.4 g/dL (ref 30.0–36.0)
MCV: 81.1 fL (ref 80.0–100.0)
Platelets: 311 10*3/uL (ref 150–400)
RBC: 5.44 MIL/uL (ref 4.22–5.81)
RDW: 13.3 % (ref 11.5–15.5)
WBC: 14.6 10*3/uL — ABNORMAL HIGH (ref 4.0–10.5)
nRBC: 0 % (ref 0.0–0.2)

## 2022-03-26 SURGERY — GASTRECTOMY, SLEEVE, ROBOT-ASSISTED
Anesthesia: General | Site: Abdomen

## 2022-03-26 MED ORDER — SODIUM CHLORIDE 0.9 % IV SOLN
2.0000 g | INTRAVENOUS | Status: AC
  Administered 2022-03-26: 2 g via INTRAVENOUS
  Filled 2022-03-26: qty 2

## 2022-03-26 MED ORDER — CHLORHEXIDINE GLUCONATE CLOTH 2 % EX PADS: 6.0000 | MEDICATED_PAD | Freq: Once | CUTANEOUS | Status: DC

## 2022-03-26 MED ORDER — OXYCODONE HCL 5 MG PO TABS
5.0000 mg | ORAL_TABLET | Freq: Four times a day (QID) | ORAL | 0 refills | Status: AC | PRN
  Filled 2022-03-26: qty 20, 5d supply, fill #0

## 2022-03-26 MED ORDER — SIMETHICONE 80 MG PO CHEW: 80.0000 mg | CHEWABLE_TABLET | Freq: Four times a day (QID) | ORAL | Status: DC | PRN

## 2022-03-26 MED ORDER — PANTOPRAZOLE SODIUM 40 MG IV SOLR
40.0000 mg | Freq: Every day | INTRAVENOUS | Status: DC
  Administered 2022-03-26: 40 mg via INTRAVENOUS
  Filled 2022-03-26: qty 10

## 2022-03-26 MED ORDER — KETAMINE HCL 10 MG/ML IJ SOLN
INTRAMUSCULAR | Status: DC | PRN
  Administered 2022-03-26: 30 mg via INTRAVENOUS
  Administered 2022-03-26: 20 mg via INTRAVENOUS

## 2022-03-26 MED ORDER — PROPOFOL 10 MG/ML IV BOLUS
INTRAVENOUS | Status: AC
  Filled 2022-03-26: qty 20

## 2022-03-26 MED ORDER — BUPIVACAINE-EPINEPHRINE 0.25% -1:200000 IJ SOLN
INTRAMUSCULAR | Status: DC | PRN
  Administered 2022-03-26: 30 mL

## 2022-03-26 MED ORDER — PROPOFOL 10 MG/ML IV BOLUS
INTRAVENOUS | Status: DC | PRN
  Administered 2022-03-26: 200 mg via INTRAVENOUS

## 2022-03-26 MED ORDER — OXYCODONE HCL 5 MG/5ML PO SOLN
ORAL | Status: AC
  Filled 2022-03-26: qty 5

## 2022-03-26 MED ORDER — HYDROXYZINE HCL 25 MG PO TABS
ORAL_TABLET | ORAL | Status: AC
  Filled 2022-03-26: qty 1

## 2022-03-26 MED ORDER — DEXAMETHASONE SODIUM PHOSPHATE 10 MG/ML IJ SOLN
INTRAMUSCULAR | Status: AC
  Filled 2022-03-26: qty 1

## 2022-03-26 MED ORDER — ACETAMINOPHEN 325 MG PO TABS
ORAL_TABLET | ORAL | Status: AC
  Filled 2022-03-26: qty 1

## 2022-03-26 MED ORDER — OXYCODONE HCL 5 MG PO TABS: 5.0000 mg | ORAL_TABLET | Freq: Once | ORAL | Status: DC | PRN

## 2022-03-26 MED ORDER — BUPIVACAINE LIPOSOME 1.3 % IJ SUSP
INTRAMUSCULAR | Status: AC
  Filled 2022-03-26: qty 20

## 2022-03-26 MED ORDER — ALBUTEROL SULFATE (2.5 MG/3ML) 0.083% IN NEBU: 3.0000 mL | INHALATION_SOLUTION | Freq: Four times a day (QID) | RESPIRATORY_TRACT | Status: DC | PRN

## 2022-03-26 MED ORDER — ONDANSETRON HCL 4 MG/2ML IJ SOLN
INTRAMUSCULAR | Status: DC | PRN
  Administered 2022-03-26: 4 mg via INTRAVENOUS

## 2022-03-26 MED ORDER — SUCCINYLCHOLINE CHLORIDE 200 MG/10ML IV SOSY
PREFILLED_SYRINGE | INTRAVENOUS | Status: DC | PRN
  Administered 2022-03-26: 200 mg via INTRAVENOUS

## 2022-03-26 MED ORDER — AMISULPRIDE (ANTIEMETIC) 5 MG/2ML IV SOLN: 10.0000 mg | Freq: Once | INTRAVENOUS | Status: DC | PRN

## 2022-03-26 MED ORDER — KETAMINE HCL 50 MG/5ML IJ SOSY
PREFILLED_SYRINGE | INTRAMUSCULAR | Status: AC
Start: 2022-03-26 — End: ?
  Filled 2022-03-26: qty 5

## 2022-03-26 MED ORDER — FENTANYL CITRATE (PF) 250 MCG/5ML IJ SOLN
INTRAMUSCULAR | Status: DC | PRN
  Administered 2022-03-26: 50 ug via INTRAVENOUS
  Administered 2022-03-26: 100 ug via INTRAVENOUS
  Administered 2022-03-26 (×2): 50 ug via INTRAVENOUS

## 2022-03-26 MED ORDER — LIDOCAINE HCL (PF) 2 % IJ SOLN
INTRAMUSCULAR | Status: AC
Start: 2022-03-26 — End: ?
  Filled 2022-03-26: qty 10

## 2022-03-26 MED ORDER — FENTANYL CITRATE PF 50 MCG/ML IJ SOSY
PREFILLED_SYRINGE | INTRAMUSCULAR | Status: AC
  Filled 2022-03-26: qty 1

## 2022-03-26 MED ORDER — ACETAMINOPHEN 500 MG PO TABS: 1000.0000 mg | ORAL_TABLET | Freq: Three times a day (TID) | ORAL | 0 refills | Status: AC

## 2022-03-26 MED ORDER — HYDROXYZINE HCL 25 MG PO TABS
25.0000 mg | ORAL_TABLET | Freq: Three times a day (TID) | ORAL | Status: DC
  Administered 2022-03-26 – 2022-03-27 (×4): 25 mg via ORAL
  Filled 2022-03-26 (×3): qty 1

## 2022-03-26 MED ORDER — LIDOCAINE HCL (PF) 2 % IJ SOLN
INTRAMUSCULAR | Status: AC
Start: 2022-03-26 — End: ?
  Filled 2022-03-26: qty 5

## 2022-03-26 MED ORDER — LACTATED RINGERS IV SOLN: INTRAVENOUS | Status: DC

## 2022-03-26 MED ORDER — GABAPENTIN 100 MG PO CAPS
200.0000 mg | ORAL_CAPSULE | Freq: Two times a day (BID) | ORAL | 0 refills | Status: AC
  Filled 2022-03-26: qty 20, 5d supply, fill #0

## 2022-03-26 MED ORDER — SUGAMMADEX SODIUM 200 MG/2ML IV SOLN
INTRAVENOUS | Status: DC | PRN
  Administered 2022-03-26: 400 mg via INTRAVENOUS

## 2022-03-26 MED ORDER — HEPARIN SODIUM (PORCINE) 5000 UNIT/ML IJ SOLN
5000.0000 [IU] | INTRAMUSCULAR | Status: AC
  Administered 2022-03-26: 5000 [IU] via SUBCUTANEOUS
  Filled 2022-03-26: qty 1

## 2022-03-26 MED ORDER — ACETAMINOPHEN 500 MG PO TABS
1000.0000 mg | ORAL_TABLET | Freq: Three times a day (TID) | ORAL | Status: DC
  Administered 2022-03-26 – 2022-03-27 (×3): 1000 mg via ORAL
  Filled 2022-03-26 (×2): qty 2

## 2022-03-26 MED ORDER — MIDAZOLAM HCL 2 MG/2ML IJ SOLN
INTRAMUSCULAR | Status: AC
  Filled 2022-03-26: qty 2

## 2022-03-26 MED ORDER — BUPIVACAINE LIPOSOME 1.3 % IJ SUSP
INTRAMUSCULAR | Status: DC | PRN
  Administered 2022-03-26: 20 mL

## 2022-03-26 MED ORDER — LIDOCAINE 2% (20 MG/ML) 5 ML SYRINGE
INTRAMUSCULAR | Status: DC | PRN
  Administered 2022-03-26: 1.5 mg/kg/h via INTRAVENOUS

## 2022-03-26 MED ORDER — ALBUTEROL SULFATE HFA 108 (90 BASE) MCG/ACT IN AERS
INHALATION_SPRAY | RESPIRATORY_TRACT | Status: AC
  Filled 2022-03-26: qty 6.7

## 2022-03-26 MED ORDER — PANTOPRAZOLE SODIUM 40 MG PO TBEC
40.0000 mg | DELAYED_RELEASE_TABLET | Freq: Every day | ORAL | 0 refills | Status: AC
  Filled 2022-03-26: qty 90, 90d supply, fill #0

## 2022-03-26 MED ORDER — FLUOXETINE HCL 20 MG PO CAPS
ORAL_CAPSULE | ORAL | Status: AC
  Filled 2022-03-26: qty 1

## 2022-03-26 MED ORDER — ROCURONIUM BROMIDE 10 MG/ML (PF) SYRINGE
PREFILLED_SYRINGE | INTRAVENOUS | Status: AC
  Filled 2022-03-26: qty 10

## 2022-03-26 MED ORDER — ACETAMINOPHEN 500 MG PO TABS
1000.0000 mg | ORAL_TABLET | ORAL | Status: AC
  Administered 2022-03-26: 1000 mg via ORAL
  Filled 2022-03-26: qty 2

## 2022-03-26 MED ORDER — LIDOCAINE 2% (20 MG/ML) 5 ML SYRINGE
INTRAMUSCULAR | Status: DC | PRN
  Administered 2022-03-26: 100 mg via INTRAVENOUS

## 2022-03-26 MED ORDER — FLUOXETINE HCL 20 MG PO CAPS
40.0000 mg | ORAL_CAPSULE | Freq: Every day | ORAL | Status: DC
  Administered 2022-03-26 – 2022-03-27 (×2): 40 mg via ORAL
  Filled 2022-03-26: qty 2

## 2022-03-26 MED ORDER — OXYCODONE HCL 5 MG/5ML PO SOLN: 5.0000 mg | Freq: Once | ORAL | Status: DC | PRN

## 2022-03-26 MED ORDER — FENTANYL CITRATE PF 50 MCG/ML IJ SOSY
25.0000 ug | PREFILLED_SYRINGE | INTRAMUSCULAR | Status: DC | PRN
  Administered 2022-03-26: 50 ug via INTRAVENOUS
  Administered 2022-03-26 (×4): 25 ug via INTRAVENOUS

## 2022-03-26 MED ORDER — ACETAMINOPHEN 160 MG/5ML PO SOLN: 1000.0000 mg | Freq: Three times a day (TID) | ORAL | Status: DC

## 2022-03-26 MED ORDER — ENSURE MAX PROTEIN PO LIQD
2.0000 [oz_av] | ORAL | Status: DC
  Administered 2022-03-27 (×2): 2 [oz_av] via ORAL

## 2022-03-26 MED ORDER — GABAPENTIN 300 MG PO CAPS
300.0000 mg | ORAL_CAPSULE | ORAL | Status: AC
  Administered 2022-03-26: 300 mg via ORAL
  Filled 2022-03-26: qty 1

## 2022-03-26 MED ORDER — OXYCODONE HCL 5 MG/5ML PO SOLN
5.0000 mg | Freq: Four times a day (QID) | ORAL | Status: DC | PRN
  Administered 2022-03-26 – 2022-03-27 (×3): 5 mg via ORAL
  Filled 2022-03-26 (×2): qty 5

## 2022-03-26 MED ORDER — SUCCINYLCHOLINE CHLORIDE 200 MG/10ML IV SOSY
PREFILLED_SYRINGE | INTRAVENOUS | Status: AC
  Filled 2022-03-26: qty 10

## 2022-03-26 MED ORDER — ALBUTEROL SULFATE HFA 108 (90 BASE) MCG/ACT IN AERS
INHALATION_SPRAY | RESPIRATORY_TRACT | Status: DC | PRN
  Administered 2022-03-26: 5 via RESPIRATORY_TRACT

## 2022-03-26 MED ORDER — MIDAZOLAM HCL 5 MG/5ML IJ SOLN
INTRAMUSCULAR | Status: DC | PRN
  Administered 2022-03-26: 2 mg via INTRAVENOUS

## 2022-03-26 MED ORDER — DEXAMETHASONE SODIUM PHOSPHATE 10 MG/ML IJ SOLN
INTRAMUSCULAR | Status: DC | PRN
  Administered 2022-03-26: 8 mg via INTRAVENOUS

## 2022-03-26 MED ORDER — ONDANSETRON HCL 4 MG/2ML IJ SOLN
INTRAMUSCULAR | Status: AC
  Filled 2022-03-26: qty 2

## 2022-03-26 MED ORDER — FENTANYL CITRATE (PF) 250 MCG/5ML IJ SOLN
INTRAMUSCULAR | Status: AC
  Filled 2022-03-26: qty 5

## 2022-03-26 MED ORDER — ORAL CARE MOUTH RINSE: 15.0000 mL | Freq: Once | OROMUCOSAL | Status: AC

## 2022-03-26 MED ORDER — SUGAMMADEX SODIUM 500 MG/5ML IV SOLN
INTRAVENOUS | Status: AC
  Filled 2022-03-26: qty 5

## 2022-03-26 MED ORDER — ONDANSETRON HCL 4 MG/2ML IJ SOLN: 4.0000 mg | Freq: Once | INTRAMUSCULAR | Status: DC | PRN

## 2022-03-26 MED ORDER — BUPIVACAINE LIPOSOME 1.3 % IJ SUSP: 20.0000 mL | Freq: Once | INTRAMUSCULAR | Status: DC

## 2022-03-26 MED ORDER — ONDANSETRON 4 MG PO TBDP
4.0000 mg | ORAL_TABLET | Freq: Four times a day (QID) | ORAL | 0 refills | Status: AC | PRN
  Filled 2022-03-26: qty 20, 5d supply, fill #0

## 2022-03-26 MED ORDER — ONDANSETRON HCL 4 MG/2ML IJ SOLN
4.0000 mg | INTRAMUSCULAR | Status: DC | PRN
  Administered 2022-03-26: 4 mg via INTRAVENOUS

## 2022-03-26 MED ORDER — ROCURONIUM BROMIDE 10 MG/ML (PF) SYRINGE
PREFILLED_SYRINGE | INTRAVENOUS | Status: DC | PRN
  Administered 2022-03-26: 100 mg via INTRAVENOUS

## 2022-03-26 MED ORDER — APREPITANT 40 MG PO CAPS
40.0000 mg | ORAL_CAPSULE | ORAL | Status: AC
  Administered 2022-03-26: 40 mg via ORAL
  Filled 2022-03-26: qty 1

## 2022-03-26 MED ORDER — BUPIVACAINE-EPINEPHRINE (PF) 0.25% -1:200000 IJ SOLN
INTRAMUSCULAR | Status: AC
Start: 2022-03-26 — End: ?
  Filled 2022-03-26: qty 30

## 2022-03-26 MED ORDER — MORPHINE SULFATE (PF) 2 MG/ML IV SOLN
1.0000 mg | INTRAVENOUS | Status: DC | PRN
  Administered 2022-03-26: 2 mg via INTRAVENOUS
  Filled 2022-03-26: qty 1

## 2022-03-26 MED ORDER — CHLORHEXIDINE GLUCONATE 0.12 % MT SOLN
15.0000 mL | Freq: Once | OROMUCOSAL | Status: AC
  Administered 2022-03-26: 15 mL via OROMUCOSAL

## 2022-03-26 MED ORDER — ENOXAPARIN SODIUM 30 MG/0.3ML IJ SOSY
30.0000 mg | PREFILLED_SYRINGE | Freq: Two times a day (BID) | INTRAMUSCULAR | Status: DC
  Administered 2022-03-26 – 2022-03-27 (×2): 30 mg via SUBCUTANEOUS
  Filled 2022-03-26 (×2): qty 0.3

## 2022-03-26 SURGICAL SUPPLY — 75 items
APPLIER CLIP 5 13 M/L LIGAMAX5 (MISCELLANEOUS)
APPLIER CLIP ROT 10 11.4 M/L (STAPLE)
BAG COUNTER SPONGE SURGICOUNT (BAG) ×2 IMPLANT
BLADE SURG SZ11 CARB STEEL (BLADE) ×2 IMPLANT
CANNULA REDUC XI 12-8 STAPL (CANNULA) ×1
CANNULA REDUCER 12-8 DVNC XI (CANNULA) ×1 IMPLANT
CHLORAPREP W/TINT 26 (MISCELLANEOUS) ×4 IMPLANT
CLIP APPLIE 5 13 M/L LIGAMAX5 (MISCELLANEOUS) IMPLANT
CLIP APPLIE ROT 10 11.4 M/L (STAPLE) IMPLANT
COVER SURGICAL LIGHT HANDLE (MISCELLANEOUS) ×2 IMPLANT
COVER TIP SHEARS 8 DVNC (MISCELLANEOUS) IMPLANT
COVER TIP SHEARS 8MM DA VINCI (MISCELLANEOUS)
DERMABOND ADVANCED (GAUZE/BANDAGES/DRESSINGS) ×1
DERMABOND ADVANCED .7 DNX12 (GAUZE/BANDAGES/DRESSINGS) ×1 IMPLANT
DRAPE ARM DVNC X/XI (DISPOSABLE) ×4 IMPLANT
DRAPE COLUMN DVNC XI (DISPOSABLE) ×1 IMPLANT
DRAPE DA VINCI XI ARM (DISPOSABLE) ×4
DRAPE DA VINCI XI COLUMN (DISPOSABLE) ×1
ELECT REM PT RETURN 15FT ADLT (MISCELLANEOUS) ×2 IMPLANT
GAUZE 4X4 16PLY ~~LOC~~+RFID DBL (SPONGE) ×2 IMPLANT
GLOVE BIO SURGEON STRL SZ 6 (GLOVE) ×3 IMPLANT
GLOVE BIO SURGEON STRL SZ7.5 (GLOVE) ×4 IMPLANT
GLOVE INDICATOR 6.5 STRL GRN (GLOVE) ×3 IMPLANT
GLOVE INDICATOR 8.0 STRL GRN (GLOVE) ×4 IMPLANT
GOWN STRL REUS W/ TWL XL LVL3 (GOWN DISPOSABLE) ×2 IMPLANT
GOWN STRL REUS W/TWL XL LVL3 (GOWN DISPOSABLE) ×2
GRASPER SUT TROCAR 14GX15 (MISCELLANEOUS) ×2 IMPLANT
HEMOSTAT SNOW SURGICEL 2X4 (HEMOSTASIS) IMPLANT
IRRIG SUCT STRYKERFLOW 2 WTIP (MISCELLANEOUS)
IRRIGATION SUCT STRKRFLW 2 WTP (MISCELLANEOUS) ×1 IMPLANT
KIT BASIN OR (CUSTOM PROCEDURE TRAY) ×2 IMPLANT
KIT TURNOVER KIT A (KITS) IMPLANT
LUBRICANT JELLY K Y 4OZ (MISCELLANEOUS) IMPLANT
MARKER SKIN DUAL TIP RULER LAB (MISCELLANEOUS) IMPLANT
MAT PREVALON FULL STRYKER (MISCELLANEOUS) ×2 IMPLANT
NDL SPNL 18GX3.5 QUINCKE PK (NEEDLE) ×1 IMPLANT
NEEDLE SPNL 18GX3.5 QUINCKE PK (NEEDLE) ×2 IMPLANT
OBTURATOR OPTICAL STANDARD 8MM (TROCAR) ×1
OBTURATOR OPTICAL STND 8 DVNC (TROCAR) ×1
OBTURATOR OPTICALSTD 8 DVNC (TROCAR) ×1 IMPLANT
PACK CARDIOVASCULAR III (CUSTOM PROCEDURE TRAY) ×2 IMPLANT
RELOAD STAPLE 60 2.5 WHT DVNC (STAPLE) IMPLANT
RELOAD STAPLE 60 3.5 BLU DVNC (STAPLE) IMPLANT
RELOAD STAPLER 2.5X60 WHT DVNC (STAPLE) ×4 IMPLANT
RELOAD STAPLER 3.5X60 BLU DVNC (STAPLE) ×2 IMPLANT
SCISSORS LAP 5X35 DISP (ENDOMECHANICALS) IMPLANT
SEAL CANN UNIV 5-8 DVNC XI (MISCELLANEOUS) ×3 IMPLANT
SEAL XI 5MM-8MM UNIVERSAL (MISCELLANEOUS) ×4
SEALER SYNCHRO 8 IS4000 DV (MISCELLANEOUS) ×1
SEALER SYNCHRO 8 IS4000 DVNC (MISCELLANEOUS) ×1 IMPLANT
SLEEVE GASTRECTOMY 40FR VISIGI (MISCELLANEOUS) ×1 IMPLANT
SOL ANTI FOG 6CC (MISCELLANEOUS) ×1 IMPLANT
SOLUTION ANTI FOG 6CC (MISCELLANEOUS) ×1
SOLUTION ELECTROLUBE (MISCELLANEOUS) ×2 IMPLANT
SPIKE FLUID TRANSFER (MISCELLANEOUS) ×2 IMPLANT
STAPLER 60 DA VINCI SURE FORM (STAPLE) ×1
STAPLER 60 SUREFORM DVNC (STAPLE) ×1 IMPLANT
STAPLER CANNULA SEAL DVNC XI (STAPLE) ×1 IMPLANT
STAPLER CANNULA SEAL XI (STAPLE) ×1
STAPLER RELOAD 2.5X60 WHITE (STAPLE) ×4
STAPLER RELOAD 2.5X60 WHT DVNC (STAPLE) ×4
STAPLER RELOAD 3.5X60 BLU DVNC (STAPLE) ×2
STAPLER RELOAD 3.5X60 BLUE (STAPLE) ×2
SUT MNCRL AB 4-0 PS2 18 (SUTURE) ×4 IMPLANT
SUT VIC AB 0 CT1 27 (SUTURE) ×1
SUT VIC AB 0 CT1 27XBRD ANTBC (SUTURE) ×1 IMPLANT
SUT VIC AB 2-0 SH 27 (SUTURE) ×1
SUT VIC AB 2-0 SH 27XBRD (SUTURE) IMPLANT
SYR 20ML LL LF (SYRINGE) ×2 IMPLANT
TOWEL OR 17X26 10 PK STRL BLUE (TOWEL DISPOSABLE) ×2 IMPLANT
TRAY FOLEY MTR SLVR 16FR STAT (SET/KITS/TRAYS/PACK) IMPLANT
TROCAR ADV FIXATION 12X100MM (TROCAR) IMPLANT
TROCAR Z-THREAD FIOS 5X100MM (TROCAR) ×2 IMPLANT
TUBE CALIBRATION LAPBAND (TUBING) IMPLANT
TUBING INSUFFLATION 10FT LAP (TUBING) ×2 IMPLANT

## 2022-03-26 NOTE — Op Note (Signed)
Patient: Nehan Flaum Pucciarelli ([birth year], [id number])  Date of Surgery: 03/26/2022   Preoperative Diagnosis: Morbid obesity   Postoperative Diagnosis: Morbid obesity   Surgical Procedure: ROBOTIC SLEEVE GASTRECTOMY:  UPPER GI ENDOSCOPY: [id number]   Operative Team Members:  Surgeon(s) and Role:    * Derron Pipkins, Hyman Hopes, MD - Primary    * Luretha Murphy, MD - Assisting   Anesthesiologist: Mellody Dance, MD CRNA: Basilio Cairo, CRNA; Orest Dikes, CRNA   Anesthesia: General   Fluids:  Total I/O In: 1100 [I.V.:1000; IV Piggyback:100] Out: -   Complications: None  Drains:  none   Specimen:  ID Type Source Tests Collected by Time Destination  1 : Stomach Tissue PATH GI benign resection SURGICAL PATHOLOGY Kamden Reber, Hyman Hopes, MD 03/26/2022 [phone]      Disposition:  PACU - hemodynamically stable.  Plan of Care: Admit to surgery floor    Indications for Procedure: Starlin Steib is a 36 y.o. male who presented with morbid obesity.    Jr Milliron Delawder is a 36 y.o. male who is seen today as an office consultation for bariatric surgery consultation. The patient has morbid obesity with a BMI of Body mass index is 41 kg/m. and the following conditions related to obesity: Obstructive sleep apnea. Notably his blood pressure was high in the office, I will have him check his blood pressure at home and follow-up with his primary care doctor if it remains high on home checks.  Today we discussed the surgical options to treat obesity and its associated comorbidity. After discussing the available procedures in the region, we discussed in great detail the surgeries I offer: robotic sleeve gastrectomy and robotic roux-en-y gastric bypass. We discussed the procedures themselves as well as their risks, benefits and alternatives. I entered the patient's basic information into the [facility] Metabolic Surgery Risk/Benefit Calculator to facilitate this discussion.   After a full  discussion and all questions answered, the patient is interested in pursuing a robotic sleeve gastrectomy.  We will initiate the bariatric surgery preoperative pathway to include the following: - Bloodwork  - Dietician consult - Completed with Serena Colonel 08/29/21 - Chest x-ray - 09/22/21 - No radiographic evidence of acute cardiopulmonary disease. - EKG 09/28/21 - Normal sinus rhythm - Psychology evaluation - Completed through [employer] [facility] and [facility]  - Upper endoscopy with biopsy. Completed - results above  Today he presents for surgery. We again discussed robotic sleeve gastrectomy. We discussed the risks, benefits, and alternatives. After full discussion all questions answered the patient again voiced consent to proceed. We will proceed as scheduled.    Findings: Normal anatomy  Infection status: Patient: Private Patient Elective Case Case: Elective Infection Present At Time Of Surgery (PATOS): None   Description of Procedure:   On the date stated above, the patient was taken to the operating room suite and placed in supine positioning.  General endotracheal anesthesia was induced.  A timeout was completed verifying the correct patient, procedure, positioning and equipment needed for the case.  The patient's abdomen was prepped and draped in the usual sterile fashion.  I entered the patient's right upper quadrant using a 5 mm trocar in the optical technique.  There was no trauma to underlying viscera with initial trocar placement.  The abdomen was insufflated 15 mmHg.  A total of 4 robotic trochars were placed across the mid abdomen, including the 5 mm initial trocar being upsized to a 8 mm trocar.  The robotic stapler trocar was  placed in the number two position.  The [facility] liver retractor was placed through the subxiphoid region and under the left lobe of the liver and was connected to the rail of the bed.  A TAP block was placed using marcaine and Exparel under direct vision  of the laparoscope.  The [employer] XI robotic platform was docked and we transitioned to robotic surgery.   Using the tip up fenestrated grasper, fenestrated bipolar, 30 degree camera and vessel sealer from the patient's right to left, we began by dissecting the angle of His off the left crus of the diaphragm.  The adhesions between the stomach, spleen and diaphragm were divided using the vessel sealer to define the angle of His.  I then started 6 cm away from the pylorus along the greater curve the stomach and divided the gastroepiploic vessels and the gastrocolic ligament.  The lesser sac was entered.  There were really no adhesions to the posterior wall of the stomach.  The greater curve was mobilized working superiorly toward the spleen.  All of the gastroepiploic and short gastric vessels were divided as we divided the gastrocolic and gastrosplenic ligaments.  As we reached the splenic hilum, I lifted the stomach anteriorly.  I created a tunnel between the stomach and its attachments to the retroperitoneum posteriorly just to the left of the GE junction until I encountered the left crus and my previous angle of His dissection.  We then were able to approach the shortest of the short gastrics both from the greater curve the stomach laterally and from the left crus medially.  These were divided using the vessel sealer and the fundus of the stomach was fully mobilized.  With the stomach fully mobilized we direct our attention to stapling.  A 40 French VISI G was inserted into the stomach and positioned along the lesser curve the stomach and suction was applied.  The 60 mm robotic sureform linear stapler was used to create the sleeve gastrectomy.  We started 6 cm from the pylorus and were careful to avoid narrowing at the incisura.  We stayed about 1 cm away from the GE junction to protect the sling fibers.  We used two blue  and four white loads of the linear stapler.  With the sleeve gastrectomy completed the  VISI G was taken off suction and removed and we performed an upper endoscopy.  The foregut was submerged in saline irrigation and the adult upper endoscope was inserted into the stomach as far as the pylorus to inspect the sleeve.  The sleeve appeared appropriately oriented without any twisting.  There was good hemostasis.  The sleeve was widely patent at the incisura with no narrowing.  There was no significant retained fundus.  The sleeve was inflated with the endoscope and there was no bubbling of the irrigation, suggesting a negative leak test and a airtight sleeve gastrectomy.  The foregut was decompressed with the endoscope and the endoscope was removed.  A omentopexy was performed along the last two staple lines using running 2-0 vicryl suture.   There was good hemostasis at the end of the case.  The robot was undocked and moved away from the field.  The sleeve gastrectomy specimen was removed from the stapler port.  The fascia of the stapler port was closed using a 0 Vicryl on a PMI suture passer.   The liver retractor was removed under direct vision.  The pneumoperitoneum was evacuated.  The skin was closed using 4-0 Monocryl and  Dermabond.  All sponge and needle counts were correct at the end of the case.    Ivar DrapePaul Rosalene Wardrop, MD General, Bariatric, & Minimally Invasive Surgery [facility] [place]

## 2022-03-26 NOTE — Progress Notes (Signed)

## 2022-03-26 NOTE — Progress Notes (Signed)
PHARMACY CONSULT FOR:  Risk Assessment for Post-Discharge VTE Following Bariatric Surgery  Post-Discharge VTE Risk Assessment: This patient's probability of 30-day post-discharge VTE is increased due to the factors marked: x Sleeve gastrectomy   Liver disorder (transplant, cirrhosis, or nonalcoholic steatohepatitis)   Hx of VTE   Hemorrhage requiring transfusion   GI perforation, leak, or obstruction   ====================================================  x  Male    Age >/=60 years    BMI >/=50 kg/m2    CHF    Dyspnea at Rest    Paraplegia  x  Non-gastric-band surgery    Operation Time >/=3 hr    Return to OR     Length of Stay >/= 3 d   Hypercoagulable condition   Significant venous stasis      Predicted probability of 30-day post-discharge VTE: 0.31%  Other patient-specific factors to consider:   Recommendation for Discharge: No pharmacologic prophylaxis post-discharge     Terry Pineda is a 36 y.o. male who underwent laparoscopic sleeve gastrectomy on 03/26/22   Case start: 0759 Case end: 0900   No Known Allergies  Patient Measurements: Height: 6\' 4"  (193 cm) Weight: (!) 152.9 kg (337 lb) IBW/kg (Calculated) : 86.8 Body mass index is 41.02 kg/m.  No results for input(s): WBC, HGB, HCT, PLT, APTT, CREATININE, LABCREA, CREATININE, CREAT24HRUR, MG, PHOS, ALBUMIN, PROT, ALBUMIN, AST, ALT, ALKPHOS, BILITOT, BILIDIR, IBILI in the last 72 hours. Estimated Creatinine Clearance: 173.4 mL/min (by C-G formula based on SCr of 0.97 mg/dL).    Past Medical History:  Diagnosis Date   Anxiety    Asthma    COPD (chronic obstructive pulmonary disease) (HCC)    Depression    GERD (gastroesophageal reflux disease)    Sleep apnea    no C-PAP     Medications Prior to Admission  Medication Sig Dispense Refill Last Dose   acidophilus (RISAQUAD) CAPS capsule Take 1 capsule by mouth daily.   Past Week   cholecalciferol (VITAMIN D3) 25 MCG (1000 UNIT) tablet Take  1,000 Units by mouth daily.   Past Week   FLUoxetine (PROZAC) 20 MG capsule Take 40 mg by mouth daily.   03/25/2022   hydrOXYzine (ATARAX) 25 MG tablet Take 25 mg by mouth 3 (three) times daily.   03/25/2022   Multiple Vitamins-Minerals (MENS MULTIVITAMIN) TABS Take 1 tablet by mouth daily.   03/25/2022   albuterol (VENTOLIN HFA) 108 (90 Base) MCG/ACT inhaler Inhale 1-2 puffs into the lungs every 6 (six) hours as needed for shortness of breath.   More than a month       Kara Mead 03/26/2022,10:44 AM

## 2022-03-26 NOTE — Discharge Instructions (Signed)

## 2022-03-26 NOTE — Anesthesia Procedure Notes (Signed)
Procedure Name: Intubation Date/Time: 03/26/2022 7:40 AM Performed by: Maxwell Caul, CRNA Pre-anesthesia Checklist: Patient identified, Emergency Drugs available, Suction available and Patient being monitored Patient Re-evaluated:Patient Re-evaluated prior to induction Oxygen Delivery Method: Circle system utilized Preoxygenation: Pre-oxygenation with 100% oxygen Induction Type: IV induction and Rapid sequence Laryngoscope Size: Mac and 4 Grade View: Grade I Tube type: Oral Tube size: 7.5 mm Number of attempts: 1 Airway Equipment and Method: Stylet and Oral airway Placement Confirmation: ETT inserted through vocal cords under direct vision, positive ETCO2 and breath sounds checked- equal and bilateral Secured at: 22 cm Tube secured with: Tape Dental Injury: Teeth and Oropharynx as per pre-operative assessment

## 2022-03-26 NOTE — Progress Notes (Signed)
  Transition of Care [facility]) Screening Note   Patient Details  Name: Terry Pineda Date of Birth: [birth year]   [facility]) CM/SW Contact:    Amada Jupiter, LCSW Phone Number: 03/26/2022, 1:20 PM    Transition of Care Department [facility]) has reviewed patient and no TOC needs have been identified at this time. We will continue to monitor patient advancement through interdisciplinary progression rounds. If new patient transition needs arise, please place a TOC consult.

## 2022-03-26 NOTE — Anesthesia Postprocedure Evaluation (Signed)
Anesthesia Post Note  Patient: Terry Pineda  Procedure(s) Performed: ROBOTIC SLEEVE GASTRECTOMY (Abdomen) UPPER GI ENDOSCOPY (Abdomen)     Patient location during evaluation: PACU Anesthesia Type: General Level of consciousness: awake Pain management: pain level controlled Vital Signs Assessment: post-procedure vital signs reviewed and stable Respiratory status: spontaneous breathing and respiratory function stable Cardiovascular status: stable Postop Assessment: no apparent nausea or vomiting Anesthetic complications: no   No notable events documented.  Last Vitals:  Vitals:   03/26/22 1025 03/26/22 1039  BP: 125/79 137/86  Pulse: 77 92  Resp: 10 18  Temp:    SpO2: 95% 95%    Last Pain:  Vitals:   03/26/22 1015  TempSrc:   PainSc: 4                  Candra R Notnamed Croucher

## 2022-03-26 NOTE — H&P (Signed)
Admitting Physician: Nickola Major [name] Cuthbertson  Service: General surgery  CC: Obesity  Subjective   HPI: Terry Pineda is an 36 y.o. male who is here for robotic sleeve gastrectomy  Past Medical History:  Diagnosis Date   Anxiety    Asthma    COPD (chronic obstructive pulmonary disease) ([place])    Depression    GERD (gastroesophageal reflux disease)    Sleep apnea    no C-PAP    Past Surgical History:  Procedure Laterality Date   BIOPSY  11/14/2021   Procedure: BIOPSY;  Surgeon: Felicie Morn, MD;  Location: [facility] ENDOSCOPY;  Service: General;;   ESOPHAGOGASTRODUODENOSCOPY N/A 11/14/2021   Procedure: ESOPHAGOGASTRODUODENOSCOPY (EGD);  Surgeon: Felicie Morn, MD;  Location: [facility];  Service: General;  Laterality: N/A;   KNEE ARTHROSCOPY Left 2013   NM RENAL LASIX ([place] HX) Bilateral 2020    History reviewed. No pertinent family history.  Social:  reports that he quit smoking about 3 years ago. His smoking use included cigarettes. He has a 15.00 pack-year smoking history. He has never used smokeless tobacco. He reports that he does not drink alcohol and does not use drugs.  Allergies: No Known Allergies  Medications: Current Outpatient Medications  Medication Instructions   acidophilus (RISAQUAD) CAPS capsule 1 capsule, Oral, Daily   albuterol (VENTOLIN HFA) 108 (90 Base) MCG/ACT inhaler 1-2 puffs, Inhalation, Every 6 hours PRN   cholecalciferol (VITAMIN D3) 1,000 Units, Oral, Daily   FLUoxetine (PROZAC) 40 mg, Oral, Daily   hydrOXYzine (ATARAX) 25 mg, Oral, 3 times daily   Multiple Vitamins-Minerals (MENS MULTIVITAMIN) TABS 1 tablet, Oral, Daily    ROS - all of the below systems have been reviewed with the patient and positives are indicated with bold text General: chills, fever or night sweats Eyes: blurry vision or double vision ENT: epistaxis or sore throat Allergy/Immunology: itchy/watery eyes or nasal congestion Hematologic/Lymphatic:  bleeding problems, blood clots or swollen lymph nodes Endocrine: temperature intolerance or unexpected weight changes Breast: new or changing breast lumps or nipple discharge Resp: cough, shortness of breath, or wheezing CV: chest pain or dyspnea on exertion GI: as per HPI GU: dysuria, trouble voiding, or hematuria MSK: joint pain or joint stiffness Neuro: TIA or stroke symptoms Derm: pruritus and skin lesion changes Psych: anxiety and depression  Objective   PE Blood pressure (!) 141/88, pulse 68, temperature 98.6 F (37 C), temperature source Oral, resp. rate 18, height 6\' 4"  (1.93 m), weight (!) 152.9 kg, SpO2 96 %. Constitutional: NAD; conversant; no deformities Eyes: Moist conjunctiva; no lid lag; anicteric; PERRL Neck: Trachea midline; no thyromegaly Lungs: Normal respiratory effort; no tactile fremitus CV: RRR; no palpable thrills; no pitting edema GI: Abd Soft, nontender; no palpable hepatosplenomegaly MSK: Normal range of motion of extremities; no clubbing/cyanosis Psychiatric: Appropriate affect; alert and oriented x3 Lymphatic: No palpable cervical or axillary lymphadenopathy  No results found for this or any previous visit (from the past 24 hour(s)).  Imaging Orders  No imaging studies ordered today   EGD 11/14/21: - Normal esophagus. - Normal stomach. Biopsied. - Normal. - Gastroesophageal flap valve classified as Hill Grade I (prominent fold, tight to endoscope). Path: - Unremarkable antral mucosa.  - No Helicobacter pylori identified.   Assessment and Plan   Terry Pineda is an 36 y.o. male with morbid obesity here for robotic sleeve gastrectomy.   Assessment   Diagnoses and all orders for this visit:  Morbid (severe) obesity due to excess calories (CMS-HCC)  Obstructive sleep apnea  Preoperative testing    Terry Pineda is a 36 y.o. male who is seen today as an office consultation at the request of Dr. Records for initial  bariatric surgery consultation. The patient has morbid obesity with a BMI of Body mass index is 41 kg/m. and the following conditions related to obesity: Obstructive sleep apnea. Notably his blood pressure was high in the office, I will have him check his blood pressure at home and follow-up with his primary care doctor if it remains high on home checks.  Today we discussed the surgical options to treat obesity and its associated comorbidity. After discussing the available procedures in the region, we discussed in great detail the surgeries I offer: robotic sleeve gastrectomy and robotic roux-en-y gastric bypass. We discussed the procedures themselves as well as their risks, benefits and alternatives. I entered the patient's basic information into the [facility] Metabolic Surgery Risk/Benefit Calculator to facilitate this discussion.   After a full discussion and all questions answered, the patient is interested in pursuing a robotic sleeve gastrectomy.  We will initiate the bariatric surgery preoperative pathway to include the following: - Bloodwork  - Dietician consult - Completed with Sandie Ano 08/29/21 - Chest x-ray - 09/22/21 - No radiographic evidence of acute cardiopulmonary disease. - EKG 09/28/21 - Normal sinus rhythm - Psychology evaluation - Completed through [place] [facility]  - Upper endoscopy with biopsy. Completed - results above  Today he presents for surgery. We again discussed robotic sleeve gastrectomy. We discussed the risks, benefits, and alternatives. After full discussion all questions answered the patient again voiced consent to proceed. We will proceed as scheduled.      Felicie Morn, MD  [facility] Use [email or web address] to contact on call provider

## 2022-03-26 NOTE — Transfer of Care (Signed)
Immediate Anesthesia Transfer of Care Note  Patient: Terry Pineda  Procedure(s) Performed: ROBOTIC SLEEVE GASTRECTOMY (Abdomen) UPPER GI ENDOSCOPY (Abdomen)  Patient Location: PACU  Anesthesia Type:General  Level of Consciousness: awake, alert  and oriented  Airway & Oxygen Therapy: Patient Spontanous Breathing and Patient connected to face mask oxygen  Post-op Assessment: Report given to RN and Post -op Vital signs reviewed and stable  Post vital signs: Reviewed and stable  Last Vitals:  Vitals Value Taken Time  BP 185/124 03/26/22 0918  Temp    Pulse 84 03/26/22 0919  Resp 13 03/26/22 0919  SpO2 100 % 03/26/22 0919  Vitals shown include unvalidated device data.  Last Pain:  Vitals:   03/26/22 0539  TempSrc: Oral  PainSc: 0-No pain      Patients Stated Pain Goal: 3 (03/26/22 0539)  Complications: No notable events documented.

## 2022-03-27 ENCOUNTER — Encounter (HOSPITAL_COMMUNITY): Payer: Self-pay | Admitting: Surgery

## 2022-03-27 LAB — CBC WITH DIFFERENTIAL/PLATELET
Abs Immature Granulocytes: 0.06 10*3/uL (ref 0.00–0.07)
Basophils Absolute: 0 10*3/uL (ref 0.0–0.1)
Basophils Relative: 0 %
Eosinophils Absolute: 0 10*3/uL (ref 0.0–0.5)
Eosinophils Relative: 0 %
HCT: 44 % (ref 39.0–52.0)
Hemoglobin: 14.3 g/dL (ref 13.0–17.0)
Immature Granulocytes: 0 %
Lymphocytes Relative: 13 %
Lymphs Abs: 1.9 10*3/uL (ref 0.7–4.0)
MCH: 26.4 pg (ref 26.0–34.0)
MCHC: 32.5 g/dL (ref 30.0–36.0)
MCV: 81.2 fL (ref 80.0–100.0)
Monocytes Absolute: 0.9 10*3/uL (ref 0.1–1.0)
Monocytes Relative: 6 %
Neutro Abs: 12.3 10*3/uL — ABNORMAL HIGH (ref 1.7–7.7)
Neutrophils Relative %: 81 %
Platelets: 299 10*3/uL (ref 150–400)
RBC: 5.42 MIL/uL (ref 4.22–5.81)
RDW: 13.2 % (ref 11.5–15.5)
WBC: 15.1 10*3/uL — ABNORMAL HIGH (ref 4.0–10.5)
nRBC: 0 % (ref 0.0–0.2)

## 2022-03-27 NOTE — Progress Notes (Signed)
Patient was given discharge instructions, and all questions were answered. Patient was stable for discharge and was walked to the main exit. 

## 2022-03-27 NOTE — Progress Notes (Signed)
24hr fluid recall: 900mL. Per dehydration protocol, will call pt to f/u within one week post op. 

## 2022-03-27 NOTE — Progress Notes (Signed)
Patient alert and oriented, pain is controlled. Patient is tolerating fluids, advanced to protein shake today, patient is tolerating well.  Reviewed Gastric sleeve discharge instructions with patient and patient is able to articulate understanding.  Provided information on BELT program, Support Group and [facility] outpatient pharmacy. All questions answered, will continue to monitor.  

## 2022-03-27 NOTE — TOC Transition Note (Signed)
Transition of Care [facility]) - CM/SW Discharge Note   Patient Details  Name: Terry Pineda MRN: [id number] Date of Birth: [birth year]  [facility] CM/SW Contact:  Golda Acre, RN Phone Number: 03/27/2022, 7:53 AM   Clinical Narrative:    No dcd needs from toc. Orders checked for hhc.   Final next level of care: Home/Self Care Barriers to Discharge: No Barriers Identified   Patient Goals and CMS Choice Patient states their goals for this hospitalization and ongoing recovery are:: to go home and lose this weight CMS Medicare.gov Compare Post Acute Care list provided to:: Patient    Discharge Placement                       Discharge Plan and Services   Discharge Planning Services: CM Consult                                 Social Determinants of Health (SDOH) Interventions     Readmission Risk Interventions     View : No data to display.

## 2022-03-27 NOTE — Discharge Summary (Signed)
  Patient ID: Terry Pineda [id number] 36 y.o. [birth year]  03/26/2022  Discharge date and time: 03/27/2022  Admitting Physician: Hyman Hopes Dericka Ostenson  Discharge Physician: Hyman Hopes America Sandall  Admission Diagnoses: Morbid obesity with BMI of 40.0-44.9, adult (HCC) [E66.01, Z68.41] Patient Active Problem List   Diagnosis Date Noted   Morbid obesity with BMI of 40.0-44.9, adult (HCC) 03/26/2022     Discharge Diagnoses: Obesity Patient Active Problem List   Diagnosis Date Noted   Morbid obesity with BMI of 40.0-44.9, adult (HCC) 03/26/2022    Operations: Procedure(s): ROBOTIC SLEEVE GASTRECTOMY UPPER GI ENDOSCOPY  Admission Condition: good  Discharged Condition: good  Indication for Admission: Obesity  Hospital Course: [name] underwent robotic sleeve gastrectomy and was discharged the following day.  Consults: None  Significant Diagnostic Studies: None  Treatments: surgery: As above  Disposition: Home  Patient Instructions:  Allergies as of 03/27/2022   No Known Allergies      Medication List     TAKE these medications    acetaminophen 500 MG tablet Commonly known as: TYLENOL Take 2 tablets (1,000 mg total) by mouth every 8 (eight) hours for 5 days.   acidophilus Caps capsule Take 1 capsule by mouth daily.   albuterol 108 (90 Base) MCG/ACT inhaler Commonly known as: VENTOLIN HFA Inhale 1-2 puffs into the lungs every 6 (six) hours as needed for shortness of breath.   cholecalciferol 25 MCG (1000 UNIT) tablet Commonly known as: VITAMIN D3 Take 1,000 Units by mouth daily.   FLUoxetine 20 MG capsule Commonly known as: PROZAC Take 40 mg by mouth daily.   gabapentin 100 MG capsule Commonly known as: NEURONTIN Take 2 capsules (200 mg total) by mouth every 12 (twelve) hours.   hydrOXYzine 25 MG tablet Commonly known as: ATARAX Take 25 mg by mouth 3 (three) times daily.   Mens Multivitamin Tabs Take 1 tablet by mouth daily.   ondansetron  4 MG disintegrating tablet Commonly known as: ZOFRAN-ODT Take 1 tablet (4 mg total) by mouth every 6 (six) hours as needed for nausea or vomiting.   oxyCODONE 5 MG immediate release tablet Commonly known as: Oxy IR/ROXICODONE Take 1 tablet (5 mg total) by mouth every 6 (six) hours as needed for severe pain.   pantoprazole 40 MG tablet Commonly known as: PROTONIX Take 1 tablet (40 mg total) by mouth daily.        Activity: no heavy lifting for 4 weeks Diet:  Bariatric diet protocol Wound Care: keep wound clean and dry  Follow-up:  With Dr. Dossie Der per bariatric follow up protocol  Signed: Hyman Hopes Jovon Streetman General, Bariatric, & Minimally Invasive Surgery [facility]   03/27/2022, 6:53 AM

## 2022-03-28 LAB — SURGICAL PATHOLOGY

## 2022-03-29 ENCOUNTER — Telehealth (HOSPITAL_COMMUNITY): Payer: Self-pay | Admitting: *Deleted

## 2022-03-29 NOTE — Telephone Encounter (Signed)
1.  Tell me about your pain and pain management? Pt states that she has been experiencing some intermittent abdominal cramping/ "gas pains" while drinking that lasts for a few seconds and then "goes away when I burp".   Pt can tolerate protein shakes and water.  Pt instructed to call [facility] if pain worsens and to make sure that he is taking small sips.  2.  Let's talk about fluid intake.  How much total fluid are you taking in? Pt states that he is getting in more than 64oz of fluid including protein shakes, bottled water, and yogurt. Pt instructed to assess status and suggestions daily utilizing Hydration Action Plan on discharge folder and to call [facility] if in the "red zone".   3.  How much protein have you taken in the last 2 days? Pt states he is meeting his goal of 80g of protein each day with the protein shakes and yogurt.  4.  Have you had nausea?  Tell me about when have experienced nausea and what you did to help? Pt denies nausea.   5.  Has the frequency or color changed with your urine? Pt states that he is urinating "fine" with no changes in frequency or urgency.     6.  Tell me what your incisions look like? "Incisions look fine". Pt denies a fever, chills.  Pt states incisions are not swollen, open, or draining.  Pt encouraged to call [facility] if incisions change.   7.  Have you been passing gas? BM? Pt states that he is having BMs. Last BM 03/28/22.     8.  If a problem or question were to arise who would you call?  Do you know contact numbers for [facility], [facility], and [facility]? Pt denies dehydration symptoms.  Pt can describe s/sx of dehydration.  Pt knows to call [facility] for surgical, [facility] for nutrition, and [facility] for non-urgent questions or concerns.   9.  How has the walking going? Pt states he is walking around and able to be active without difficulty.   10. Are you still using your incentive spirometer?  If so, how often? Pt states that he is doing the I.S. "pretty often and going all the way up to  the top".   Pt encouraged to use incentive spirometer, at least 10x every hour while awake until he sees the surgeon.  11.  How are your vitamins and calcium going?  How are you taking them? Pt states that he is taking his supplements and vitamins without difficulty.  Reminded patient that the first 30 days post-operatively are important for successful recovery.  Practice good hand hygiene, wearing a mask when appropriate (since optional in most places), and minimizing exposure to people who live outside of the home, especially if they are exhibiting any respiratory, GI, or illness-like symptoms.

## 2022-04-10 ENCOUNTER — Encounter: Payer: Self-pay | Admitting: Dietician

## 2022-04-10 ENCOUNTER — Encounter: Payer: No Typology Code available for payment source | Attending: Surgery | Admitting: Dietician

## 2022-04-10 DIAGNOSIS — E669 Obesity, unspecified: Secondary | ICD-10-CM | POA: Diagnosis present

## 2022-04-10 NOTE — Progress Notes (Signed)
2 Week Post-Operative Nutrition Class   Patient was seen on 04/10/2022 for Post-Operative Nutrition education at the [facility].    Surgery date: 03/26/2022 Surgery type: Sleeve Gastrectomy   NUTRITION ASSESSMENT   Anthropometrics  Start weight at [facility]: 327.5 lbs (date: 08/29/2021)  Height: 75.5 in Weight today:  321.0 lbs BMI: 39.07 kg/m2     Clinical  Medical hx: COPD, sleep apnea, GAD, Major depressive  Medications: see list; multivitamin   Labs: Vitamin D 20.8 Notable signs/symptoms: some knee joint pain Any previous deficiencies? Yes: vitamin D   Body Composition Scale 04/10/2022  Current Body Weight 321.0  Total Body Fat % 33.5  Visceral Fat 22  Fat-Free Mass % 66.4   Total Body Water % 47.4  Muscle-Mass lbs 60.6  BMI 38.8  Body Fat Displacement          Torso  lbs 66.8         Left Leg  lbs 13.3         Right Leg  lbs 13.3         Left Arm  lbs 6.6         Right Arm   lbs 6.6      The following the learning objectives were met by the patient during this course: Identifies Phase 3 (Soft, High Proteins) Dietary Goals and will begin from 2 weeks post-operatively to 2 months post-operatively Identifies appropriate sources of fluids and proteins  Identifies appropriate fat sources and healthy verses unhealthy fat types   States protein recommendations and appropriate sources post-operatively Identifies the need for appropriate texture modifications, mastication, and bite sizes when consuming solids Identifies appropriate fat consumption and sources Identifies appropriate multivitamin and calcium sources post-operatively Describes the need for physical activity post-operatively and will follow MD recommendations States when to call healthcare provider regarding medication questions or post-operative complications   Handouts given during class include: Phase 3A: Soft, High Protein Diet Handout Phase 3 High Protein Meals Healthy  Fats   Follow-Up Plan: Patient will follow-up at [facility] in 6 weeks for 2 month post-op nutrition visit for diet advancement per MD.

## 2022-04-16 ENCOUNTER — Telehealth: Payer: Self-pay | Admitting: Dietician

## 2022-05-22 ENCOUNTER — Encounter: Payer: No Typology Code available for payment source | Attending: Surgery | Admitting: Dietician

## 2022-05-22 ENCOUNTER — Encounter: Payer: Self-pay | Admitting: Dietician

## 2022-05-22 DIAGNOSIS — E669 Obesity, unspecified: Secondary | ICD-10-CM | POA: Diagnosis not present

## 2022-05-22 NOTE — Progress Notes (Signed)
Bariatric Nutrition Follow-Up Visit Medical Nutrition Therapy  Appt Start Time: 11:25   End Time: 12:02  Surgery date: 03/26/2022 Surgery type: Sleeve Gastrectomy   NUTRITION ASSESSMENT  Anthropometrics  Start weight at [facility]: 327.5 lbs (date: 08/29/2021)  Height: 75.5 in Weight today: 295.0 lbs BMI: 35.91 kg/m2     Clinical  Medical hx: COPD, sleep apnea, GAD, Major depressive  Medications: see list; multivitamin   Labs: Vitamin D 20.8 Notable signs/symptoms: some knee joint pain Any previous deficiencies? Yes: vitamin D  Body Composition Scale 04/10/2022 05/22/2022  Current Body Weight 321.0 295.0  Total Body Fat % 33.5 30.7  Visceral Fat 22 19  Fat-Free Mass % 66.4 69.2   Total Body Water % 47.4 50.2  Muscle-Mass lbs 60.6 55.6  BMI 38.8 35.6  Body Fat Displacement           Torso  lbs 66.8 56.2         Left Leg  lbs 13.3 11.2         Right Leg  lbs 13.3 11.2         Left Arm  lbs 6.6 5.6         Right Arm   lbs 6.6 5.6    Lifestyle & Dietary Hx  Pt states his activity level is light to moderate, stating he goes to the gym a few times a week, and walks the dog a few times a day.  Pt states he weighs his food, with a food scale. Pt states he meal preps his meat, batch cooking. Pt states he is trying to eat slow, stating he has always eaten fast.  Pt states he tries to use his non-dominate hand to help him slow down while he eats. Pt states he has not had any problems with any food.  Pt states he had one slice of pizza at a birthday party. Pt states he sometimes feels light headed when he takes his multivitamin (with iron) capsule, stating he has to take several gulps of water to get the capsule down. Pt states he consumes one protein shake a day. Pt states his bowel movements are fine, stating he has one about every other day.  Estimated daily fluid intake: 45-50, sometimes 64 oz Estimated daily protein intake: 80+ g (pt states he tries not to go over 100  g.) Supplements: multivitamin and calcium Current average weekly physical activity: Pt states his activity level is light to moderate, stating he goes to the gym a few times a week, and walks the dog a few times a day.    24-Hr Dietary Recall First Meal: 2 eggs, 1/3 cup cheese (19-20 g. Protein) Snack:  Second Meal: yogurt or pork chop (18-19 g. Protein) protein shake (close to lunch or with lunch) Snack: yogurt or pork chop  Third Meal: pork chop or chicken or [place] or shredded pork (about 4 oz) Snack: yogurt or none or cheese stick Beverages: water  Post-Op Goals/ Signs/ Symptoms Using straws: no Drinking while eating: no Chewing/swallowing difficulties: no Changes in vision: no Changes to mood/headaches: no Hair loss/changes to skin/nails: no Difficulty focusing/concentrating: no Sweating: no Limb weakness: no Dizziness/lightheadedness: no Palpitations: no  Carbonated/caffeinated beverages: no N/V/D/C/Gas: no Abdominal pain: no Dumping syndrome: no    NUTRITION DIAGNOSIS  Overweight/obesity ([place]-3.3) related to past poor dietary habits and physical inactivity as evidenced by completed bariatric surgery and following dietary guidelines for continued weight loss and healthy nutrition status.     NUTRITION INTERVENTION Nutrition counseling (  C-1) and education (E-2) to facilitate bariatric surgery goals, including: Diet advancement to the next phase (phase 4) now including non-starchy vegetables The importance of consuming adequate calories as well as certain nutrients daily due to the body's need for essential vitamins, minerals, and fats The importance of daily physical activity and to reach a goal of at least 150 minutes of moderate to vigorous physical activity weekly (or as directed by their physician) due to benefits such as increased musculature and improved lab values The importance of intuitive eating specifically learning hunger-satiety cues and understanding the  importance of learning a new body: The importance of mindful eating to avoid grazing behaviors   Handouts Provided Include  Phase 4 Food Plan  Learning Style & Readiness for Change Teaching method utilized: Visual & Auditory  Demonstrated degree of understanding via: Teach Back  Readiness Level: action Barriers to learning/adherence to lifestyle change: none identified  RD's Notes for Next Visit Assess adherence to pt chosen goals   MONITORING & EVALUATION Dietary intake, weekly physical activity, body weight.  Next Steps Patient is to follow-up in 4 months for 6 month post-op follow-up.

## 2022-08-28 ENCOUNTER — Encounter: Payer: Self-pay | Admitting: Dietician

## 2022-08-28 ENCOUNTER — Encounter: Payer: Non-veteran care | Attending: Surgery | Admitting: Dietician

## 2022-08-28 VITALS — Ht 75.5 in | Wt 245.1 lb

## 2022-08-28 DIAGNOSIS — E669 Obesity, unspecified: Secondary | ICD-10-CM | POA: Insufficient documentation

## 2022-08-28 NOTE — Progress Notes (Signed)
Bariatric Nutrition Follow-Up Visit Medical Nutrition Therapy  Appt Start Time: 11:25   End Time: 12:02  Surgery date: 03/26/2022 Surgery type: Sleeve Gastrectomy   NUTRITION ASSESSMENT  Anthropometrics  Start weight at [facility]: 327.5 lbs (date: 08/29/2021)  Height: 75.5 in Weight today: 245.1 lbs BMI: 30.23 kg/m2     Clinical  Medical hx: COPD, sleep apnea, GAD, Major depressive  Medications: see list; multivitamin   Labs: Vitamin D 20.8 Notable signs/symptoms: some knee joint pain Any previous deficiencies? Yes: vitamin D  Body Composition Scale 04/10/2022 05/22/2022 08/28/2022  Current Body Weight 321.0 295.0 245.1  Total Body Fat % 33.5 30.7 24.3  Visceral Fat 22 19 13   Fat-Free Mass % 66.4 69.2 75.6   Total Body Water % 47.4 50.2 56.6  Muscle-Mass lbs 60.6 55.6 46.2  BMI 38.8 35.6 29.6  Body Fat Displacement            Torso  lbs 66.8 56.2 36.9         Left Leg  lbs 13.3 11.2 7.3         Right Leg  lbs 13.3 11.2 7.3         Left Arm  lbs 6.6 5.6 3.6         Right Arm   lbs 6.6 5.6 3.6    Lifestyle & Dietary Hx  Pt states he goes off track sometimes, stating he had some pizza one day and some candy on halloween. Pt states he often gets nauseated from the multivitamin, stating he will vomit them about 25% of the time. Pt states the current multivitamin is for 3 times a day. Pt states he hasn't really gotten into vegetables. Pt states he had a mental health issue in October, stating it is off and on now. Pt is losing muscle mass per the body composition scale.  Estimated daily fluid intake: 45-50, sometimes 64 oz Estimated daily protein intake: 80+ g (pt states he tries not to go over 100 g.) Supplements: multivitamin and calcium Current average weekly physical activity: Pt states mild physical activity, walking around the block with the dog. Pt states he has knee pain followed by mental anguish.  24-Hr Dietary Recall First Meal: 2 eggs, protein bar Snack: dark  chocolate powder almonds Second Meal: protein shake, Gatorade zero Snack: yogurt (Oikos) Third Meal: steak (once a week) or pork chop or chicken or [place] or shredded pork cooked in Dr. Malachi Bonds (about 4 oz) Snack: yogurt or none or cheese stick Beverages: water, Gatorade zero, propel powder packets for water, diet green tea  Post-Op Goals/ Signs/ Symptoms Using straws: no Drinking while eating: no Chewing/swallowing difficulties: no Changes in vision: no Changes to mood/headaches: no Hair loss/changes to skin/nails: no Difficulty focusing/concentrating: no Sweating: no Limb weakness: no Dizziness/lightheadedness: no Palpitations: no  Carbonated/caffeinated beverages: no N/V/D/C/Gas: no Abdominal pain: no Dumping syndrome: no    NUTRITION DIAGNOSIS  Overweight/obesity ([place]-3.3) related to past poor dietary habits and physical inactivity as evidenced by completed bariatric surgery and following dietary guidelines for continued weight loss and healthy nutrition status.     NUTRITION INTERVENTION Nutrition counseling (C-1) and education (E-2) to facilitate bariatric surgery goals, including: Diet advancement to the next phase (phase 6) now including fruit The importance of consuming adequate calories as well as certain nutrients daily due to the body's need for essential vitamins, minerals, and fats The importance of daily physical activity and to reach a goal of at least 150 minutes of moderate to vigorous physical  activity weekly (or as directed by their physician) due to benefits such as increased musculature and improved lab values The importance of intuitive eating specifically learning hunger-satiety cues and understanding the importance of learning a new body: The importance of mindful eating to avoid grazing behaviors  Why you need complex carbohydrates: complex carbohydrates are required to have a healthy diet. Fruits and starchy vegetables provide essential vitamins and  minerals required for immune function, eyesight support, brain support, bone density, wound healing and many other functions within the body. According to the current evidenced based 2020-2025 Dietary Guidelines for Americans, complex carbohydrates are part of a healthy eating pattern which is associated with a decreased risk for type 2 diabetes, cancers, and cardiovascular disease.    Goals -include complex carbohydrate in form of starchy vegetable or fruit, for source of energy, to avoid muscle waisting. -incorporate resistance bands and light weights in addition to walking to maintain muscle mass. -try another recommended multivitamin that will cause less nauseousness, maybe one that is once a day rather than 3 times a day.  Handouts Provided Include  Phase 6 Food Plan  Learning Style & Readiness for Change Teaching method utilized: Visual & Auditory  Demonstrated degree of understanding via: Teach Back  Readiness Level: contemplative  Barriers to learning/adherence to lifestyle change: mental health  RD's Notes for Next Visit Assess adherence to pt chosen goals   MONITORING & EVALUATION Dietary intake, weekly physical activity, body weight.  Next Steps Patient is to follow-up in 3 months.

## 2022-11-05 IMAGING — DX DG CHEST 2V
2 series · 2 of 2 positions shown · non-contrast
Comparison: No priors.

CLINICAL DATA: 35-year-old male with history of morbid obesity.
Preoperative study prior to potential bariatric surgery.

EXAM:
CHEST - 2 VIEW

[chest pa]
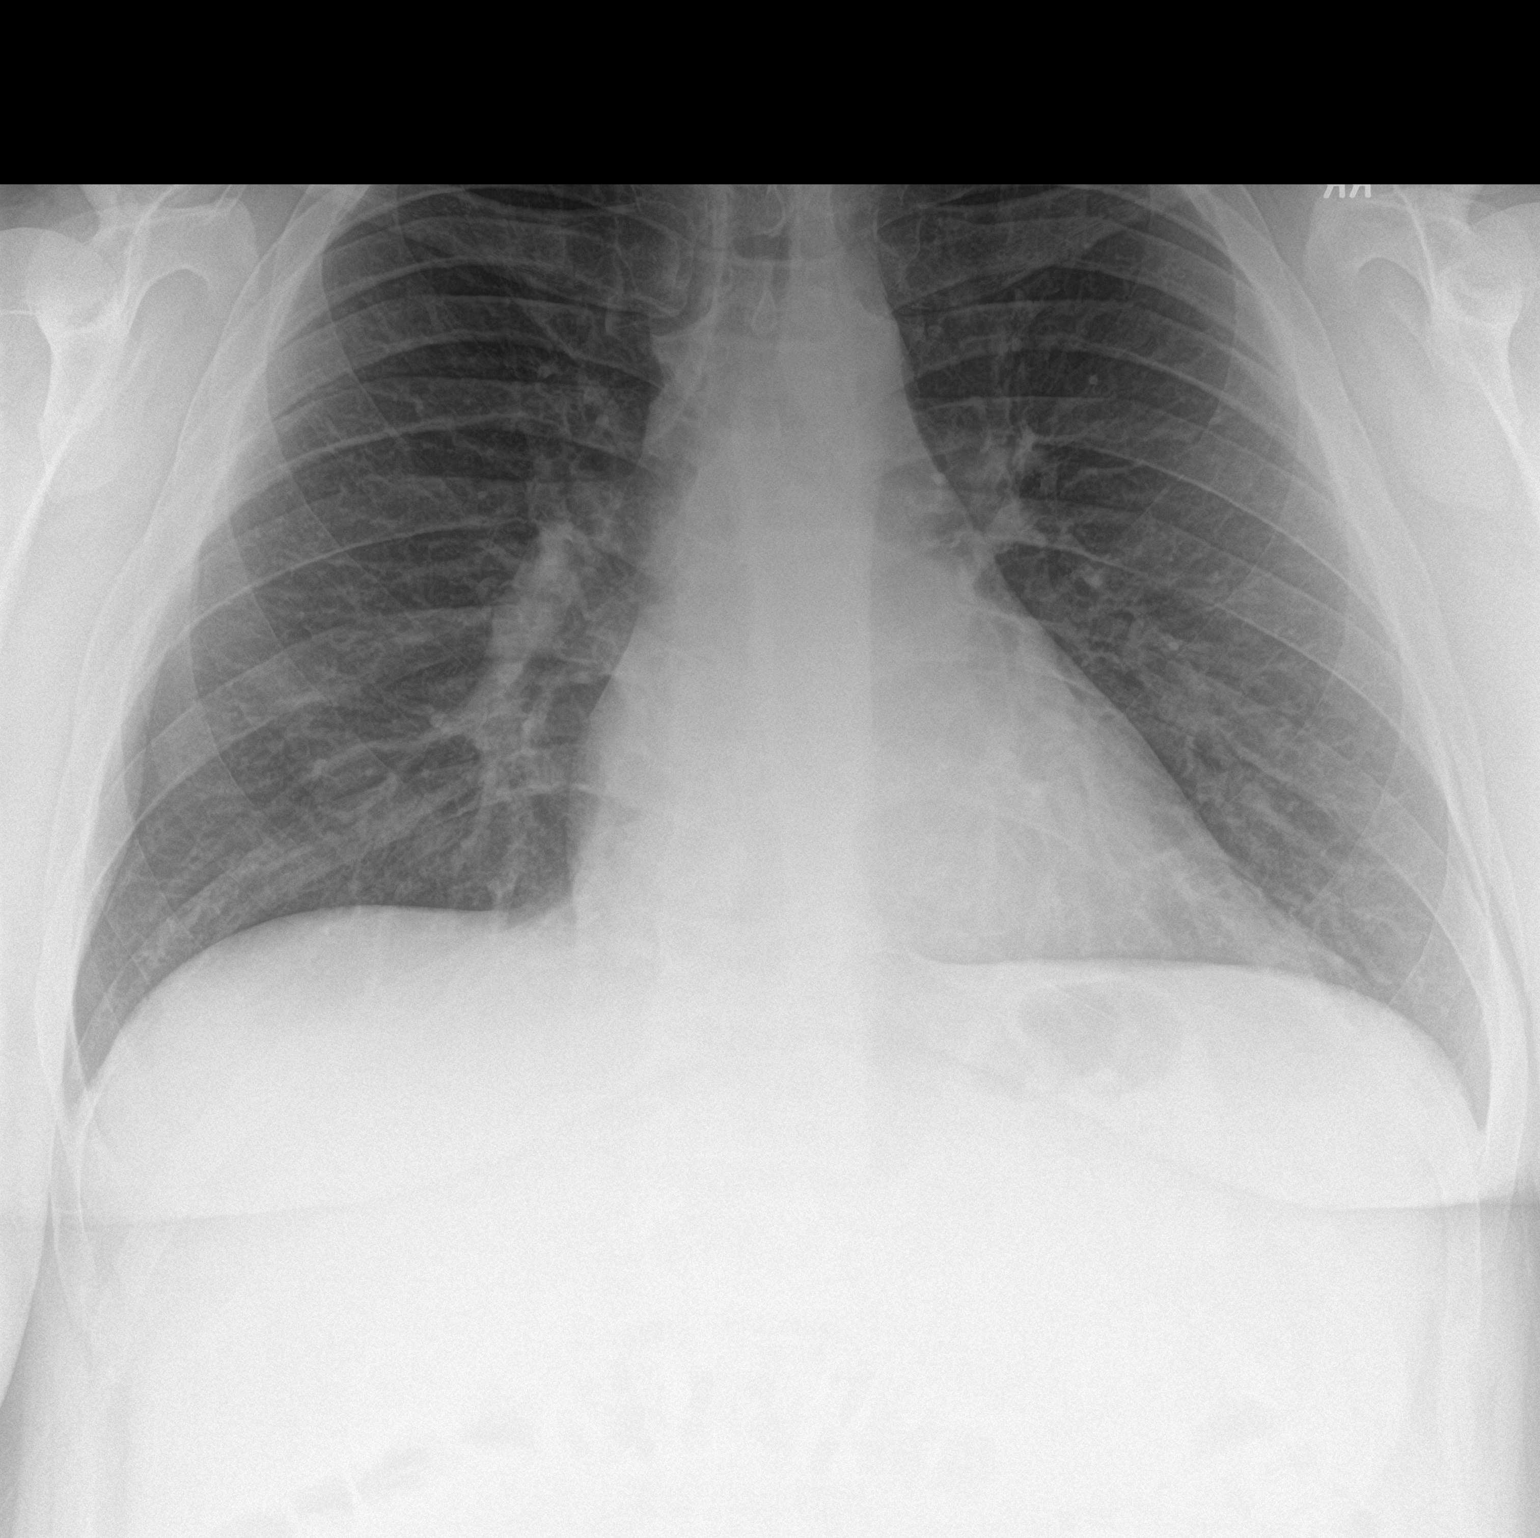

[chest lat]
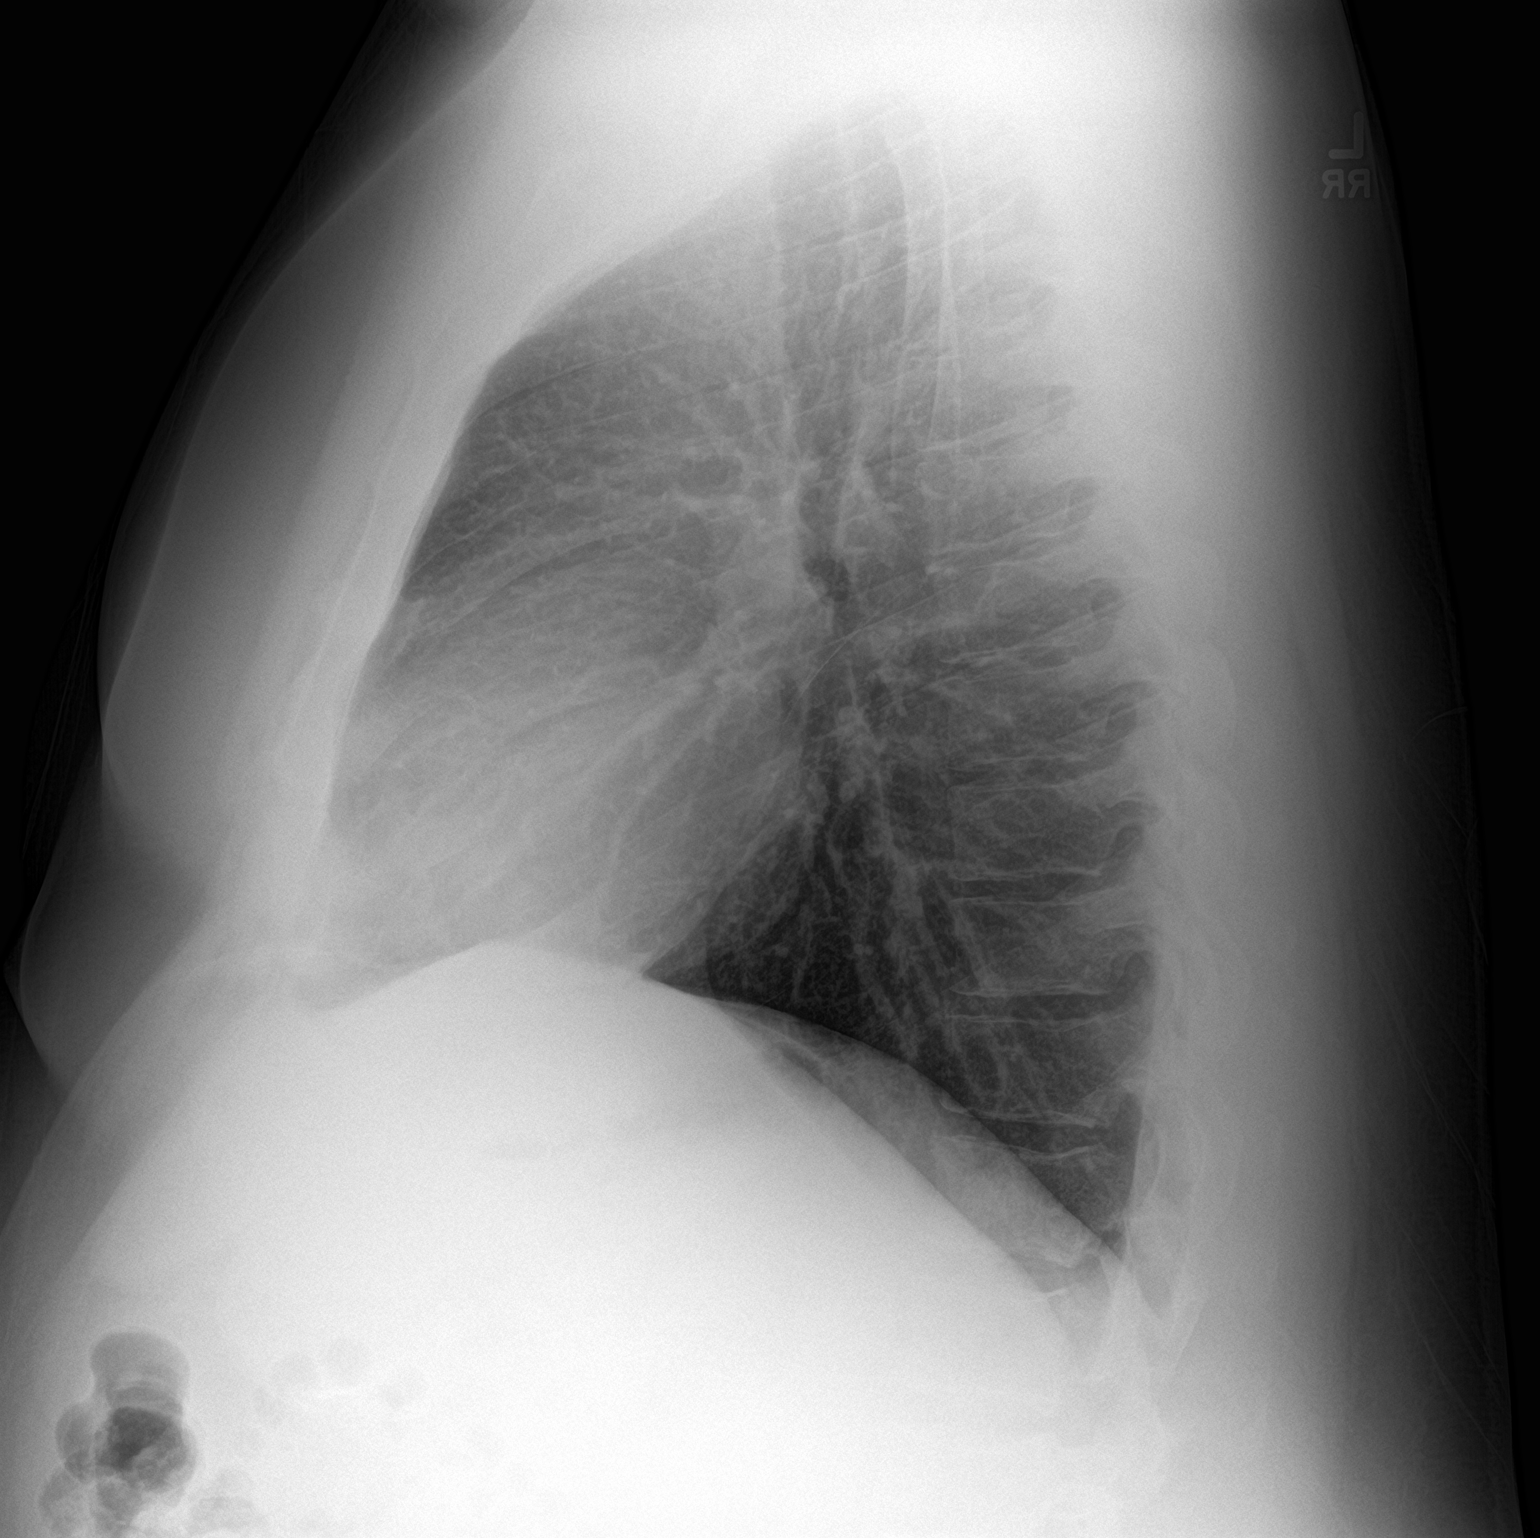

[2 of 2 positions shown; findings below may reference images not displayed]

FINDINGS: Lung volumes are normal. No consolidative airspace disease. No
pleural effusions. No pneumothorax. No pulmonary nodule or mass
noted. Pulmonary vasculature and the cardiomediastinal silhouette
are within normal limits.
IMPRESSION: No radiographic evidence of acute cardiopulmonary disease.

## 2022-11-27 ENCOUNTER — Encounter: Payer: Self-pay | Admitting: Dietician

## 2022-11-27 ENCOUNTER — Encounter: Payer: No Typology Code available for payment source | Attending: Surgery | Admitting: Dietician

## 2022-11-27 VITALS — Ht 75.5 in | Wt 216.8 lb

## 2022-11-27 DIAGNOSIS — E669 Obesity, unspecified: Secondary | ICD-10-CM | POA: Diagnosis present

## 2022-11-27 NOTE — Progress Notes (Signed)
Bariatric Nutrition Follow-Up Visit Medical Nutrition Therapy  Appt Start Time: 11:00 End Time: 11:44  Surgery date: 03/26/2022 Surgery type: Sleeve Gastrectomy   NUTRITION ASSESSMENT  Anthropometrics  Start weight at [facility]: 327.5 lbs (date: 08/29/2021)  Height: 75.5 in Weight today: 216.8 lbs BMI: 26.74 kg/m2     Clinical  Medical hx: COPD, sleep apnea, GAD, Major depressive  Medications: see list; multivitamin   Labs: Vitamin D 20.8 Notable signs/symptoms: some knee joint pain Any previous deficiencies? Yes: vitamin D  Body Composition Scale 04/10/2022 05/22/2022 08/28/2022 11/27/2022  Current Body Weight 321.0 295.0 245.1 216.8  Total Body Fat % 33.5 30.7 24.3 18.1  Visceral Fat 22 19 13 10   Fat-Free Mass % 66.4 69.2 75.6 81.8   Total Body Water % 47.4 50.2 56.6 62.8  Muscle-Mass lbs 60.6 55.6 46.2 40.7  BMI 38.8 35.6 29.6 26.1  Body Fat Displacement             Torso  lbs 66.8 56.2 36.9 24.4         Left Leg  lbs 13.3 11.2 7.3 4.8         Right Leg  lbs 13.3 11.2 7.3 4.8         Left Arm  lbs 6.6 5.6 3.6 2.4         Right Arm   lbs 6.6 5.6 3.6 2.4    Lifestyle & Dietary Hx  Pt states he had a hemorrhoid surgically removed. Pt states he eats variety of foods, stating some things are good for me and some things are not good for me. Pt states he still drinks ensure protein shake daily. Pt states the multivitamin made him nauseous and sometimes throws it up, stating he is taking one of a 3 a day multivitamin right now. Pt states he eats the same as he did before surgery, stating he feels his metabolism has increased and states the food goes through him faster.  Estimated daily fluid intake: 64+ oz Estimated daily protein intake: 80-90 grams Supplements: 1/3 multivitamin, no calcium Current average weekly physical activity: gym 2 days a week (cardio/treadmill/swimming); treadmill on an incline.  24-Hr Dietary Recall First Meal: 2 eggs, protein bar, or  peas/beans/broth and protein shake Snack: dark chocolate powder almonds Second Meal: protein shake, Gatorade zero, almonds Snack: yogurt (Oikos) Third Meal: steak (once a week) or pork chop or chicken or [place] or shredded pork cooked or pork chops Snack: yogurt or none or cheese stick Beverages: water, Gatorade zero, propel powder packets for water, diet green tea  Post-Op Goals/ Signs/ Symptoms Using straws: no Drinking while eating: no Chewing/swallowing difficulties: no Changes in vision: no Changes to mood/headaches: no Hair loss/changes to skin/nails: no Difficulty focusing/concentrating: no Sweating: no Limb weakness: no Dizziness/lightheadedness: no Palpitations: no  Carbonated/caffeinated beverages: no N/V/D/C/Gas: no Abdominal pain: no Dumping syndrome: no    NUTRITION DIAGNOSIS  Overweight/obesity ([place]-3.3) related to past poor dietary habits and physical inactivity as evidenced by completed bariatric surgery and following dietary guidelines for continued weight loss and healthy nutrition status.     NUTRITION INTERVENTION Nutrition counseling (C-1) and education (E-2) to facilitate bariatric surgery goals, including: Diet advancement to the next phase including all foods. The importance of consuming adequate calories as well as certain nutrients daily due to the body's need for essential vitamins, minerals, and fats. Discussed the need to stay at current weight.  Eat 3 meals a day, including snacks in between meals. The importance of daily physical activity  and to reach a goal of at least 150 minutes of moderate to vigorous physical activity weekly (or as directed by their physician) due to benefits such as increased musculature and improved lab values The importance of intuitive eating specifically learning hunger-satiety cues and understanding the importance of learning a new body: The importance of mindful eating to avoid grazing behaviors  Why you need complex  carbohydrates: complex carbohydrates are required to have a healthy diet. Fruits and starchy vegetables provide essential vitamins and minerals required for immune function, eyesight support, brain support, bone density, wound healing and many other functions within the body. According to the current evidenced based 2020-2025 Dietary Guidelines for Americans, complex carbohydrates are part of a healthy eating pattern which is associated with a decreased risk for type 2 diabetes, cancers, and cardiovascular disease.  Discussed the need to stay at current weight.  Goals -Re-engage: include complex carbohydrate in form of starchy vegetable, whole grains and whole fruit, for source of energy, to avoid muscle waisting. -Re-engage: incorporate resistance bands and light weights in addition to walking to maintain muscle mass. -Re-engage: try another recommended multivitamin that will cause less nauseousness, maybe a different recommended brand or one that is once a day rather than 3 times a day. Start calcium supplement again. -New: eat 3 meals a day with snacks; maintain current weight.  Handouts Provided Include  Bariatric MyPlate Meal Ideas Handout  Learning Style & Readiness for Change Teaching method utilized: Visual & Auditory  Demonstrated degree of understanding via: Teach Back  Readiness Level: contemplative  Barriers to learning/adherence to lifestyle change: mental health  RD's Notes for Next Visit Assess adherence to pt chosen goals   MONITORING & EVALUATION Dietary intake, weekly physical activity, body weight.  Next Steps Patient is to follow-up in 2 months.

## 2023-01-29 ENCOUNTER — Encounter: Payer: Self-pay | Admitting: Dietician

## 2023-01-29 ENCOUNTER — Encounter: Payer: No Typology Code available for payment source | Attending: Surgery | Admitting: Dietician

## 2023-01-29 VITALS — Ht 75.5 in | Wt 219.3 lb

## 2023-01-29 DIAGNOSIS — E669 Obesity, unspecified: Secondary | ICD-10-CM | POA: Insufficient documentation

## 2023-01-29 NOTE — Progress Notes (Signed)
Bariatric Nutrition Follow-Up Visit Medical Nutrition Therapy  Appt Start Time: 9:46 End Time: 10:23  Surgery date: 03/26/2022 Surgery type: Sleeve Gastrectomy   NUTRITION ASSESSMENT  Anthropometrics  Start weight at [facility]: 327.5 lbs (date: 08/29/2021)  Height: 75.5 in Weight today: 219.3 lbs BMI: 27.05 kg/m2     Clinical  Medical hx: COPD, sleep apnea, GAD, Major depressive  Medications: see list; multivitamin   Labs: Vitamin D 20.8 Notable signs/symptoms: some knee joint pain Any previous deficiencies? Yes: vitamin D  Body Composition Scale 04/10/2022 05/22/2022 08/28/2022 11/27/2022 01/29/2023  Current Body Weight 321.0 295.0 245.1 216.8 219.3  Total Body Fat % 33.5 30.7 24.3 18.1 16.5  Visceral Fat 22 19 13 10 10   Fat-Free Mass % 66.4 69.2 75.6 81.8 83.4   Total Body Water % 47.4 50.2 56.6 62.8 64.4  Muscle-Mass lbs 60.6 55.6 46.2 40.7 41.3  BMI 38.8 35.6 29.6 26.1 26.4  Body Fat Displacement              Torso  lbs 66.8 56.2 36.9 24.4 22.4         Left Leg  lbs 13.3 11.2 7.3 4.8 4.4         Right Leg  lbs 13.3 11.2 7.3 4.8 4.4         Left Arm  lbs 6.6 5.6 3.6 2.4 2.2         Right Arm   lbs 6.6 5.6 3.6 2.4 2.2    Lifestyle & Dietary Hx  Pt states he would like to build muscle. Pt states he has problems with his right knee with lower body weights. Pt states if he messes up and injures himself he is down for awhile. Pt states the doctors are recommending a knee replacement. Pt states he will use a recumbent bike for lower body. Pt states he came her before eating anything. Pt states he is trying to get more fiber from black beans, steamed broccoli, green beans. [facility] a multivitamin (Bariatric Advantage) free of charge, stating he only takes 1 of 3.  Estimated daily fluid intake: 64+ oz Estimated daily protein intake: 80-90 grams Supplements: multivitamin (bariatric advantage), no calcium Current average weekly physical activity: gym 2 days a  week (cardio/treadmill/swimming); treadmill on an incline.  24-Hr Dietary Recall First Meal: 2 eggs, protein bar, or peas/beans/broth and protein shake or boiled eggs or oatmeal (protein oatmeal) with oat milk Snack: dark chocolate powder almonds Second Meal: protein shake, Gatorade zero, almonds or pork chops and mac and cheese. Snack: yogurt (Oikos) or real fruit bar Third Meal: steak (once a week) or pork chop or chicken or [place] or shredded pork cooked Snack: yogurt or none or cheese stick Beverages: water, Gatorade zero, propel powder packets for water, diet green tea  Post-Op Goals/ Signs/ Symptoms Using straws: no Drinking while eating: no Chewing/swallowing difficulties: no Changes in vision: no Changes to mood/headaches: no Hair loss/changes to skin/nails: no Difficulty focusing/concentrating: no Sweating: no Limb weakness: no Dizziness/lightheadedness: no Palpitations: no  Carbonated/caffeinated beverages: no N/V/D/C/Gas: no Abdominal pain: no Dumping syndrome: no    NUTRITION DIAGNOSIS  Overweight/obesity ([place]-3.3) related to past poor dietary habits and physical inactivity as evidenced by completed bariatric surgery and following dietary guidelines for continued weight loss and healthy nutrition status.     NUTRITION INTERVENTION Nutrition counseling (C-1) and education (E-2) to facilitate bariatric surgery goals, including: The importance of consuming adequate calories as well as certain nutrients daily due to the  body's need for essential vitamins, minerals, and fats. Discussed the need to stay at current weight.  Eat 3 meals a day, including snacks in between meals. The importance of daily physical activity and to reach a goal of at least 150 minutes of moderate to vigorous physical activity weekly (or as directed by their physician) due to benefits such as increased musculature and improved lab values The importance of intuitive eating specifically learning  hunger-satiety cues and understanding the importance of learning a new body: The importance of mindful eating to avoid grazing behaviors  Why you need complex carbohydrates: complex carbohydrates are required to have a healthy diet. Fruits and starchy vegetables provide essential vitamins and minerals required for immune function, eyesight support, brain support, bone density, wound healing and many other functions within the body. According to the current evidenced based 2020-2025 Dietary Guidelines for Americans, complex carbohydrates are part of a healthy eating pattern which is associated with a decreased risk for type 2 diabetes, cancers, and cardiovascular disease.  Discussed the need to stay at current weight.  Goals -Continue: include complex carbohydrate in form of starchy vegetable, whole grains and whole fruit, for source of energy, to avoid muscle waisting. -Continue: incorporate resistance bands and light weights in addition to walking to maintain muscle mass. -Continue: eat 3 meals a day with snacks; maintain current weight. -Re-engage: avoid drinking with meals -New: get labs drawn when first available, since only taking 1 of 3 a day Bariatric Advantage. -New: [facility] if [facility] B[facility]ric Advantage Ultra Solo  Handouts Provided Include  Bariatric MyPlate  Learning Style & Readiness for Change Teaching method utilized: Visual & Auditory  Demonstrated degree of understanding via: Teach Back  Readiness Level: contemplative  Barriers to learning/adherence to lifestyle change: mental health  RD's Notes for Next Visit Assess adherence to pt chosen goals  MONITORING & EVALUATION Dietary intake, weekly physical activity, body weight.  Next Steps Patient is to follow-up in 3 months for follow-up.

## 2023-05-21 ENCOUNTER — Ambulatory Visit: Payer: No Typology Code available for payment source | Admitting: Dietician

## 2023-11-01 ENCOUNTER — Encounter (HOSPITAL_COMMUNITY): Payer: Self-pay | Admitting: *Deleted
